# Patient Record
Sex: Female | Born: 1945 | Race: White | Hispanic: No | Marital: Married | Smoking: Never smoker
Health system: Southern US, Community
[De-identification: ages and names within clinical notes are randomized; demographics above are authoritative.]

## PROBLEM LIST (undated history)

## (undated) DIAGNOSIS — K7460 Unspecified cirrhosis of liver: Secondary | ICD-10-CM

## (undated) DIAGNOSIS — K760 Fatty (change of) liver, not elsewhere classified: Secondary | ICD-10-CM

## (undated) DIAGNOSIS — E785 Hyperlipidemia, unspecified: Secondary | ICD-10-CM

## (undated) DIAGNOSIS — E876 Hypokalemia: Secondary | ICD-10-CM

## (undated) DIAGNOSIS — K219 Gastro-esophageal reflux disease without esophagitis: Secondary | ICD-10-CM

## (undated) DIAGNOSIS — E119 Type 2 diabetes mellitus without complications: Secondary | ICD-10-CM

## (undated) DIAGNOSIS — C801 Malignant (primary) neoplasm, unspecified: Secondary | ICD-10-CM

## (undated) DIAGNOSIS — I10 Essential (primary) hypertension: Secondary | ICD-10-CM

## (undated) HISTORY — DX: Essential (primary) hypertension: I10

## (undated) HISTORY — PX: KNEE SURGERY: SHX244

## (undated) HISTORY — DX: Hypokalemia: E87.6

## (undated) HISTORY — DX: Hyperlipidemia, unspecified: E78.5

---

## 2004-07-22 ENCOUNTER — Ambulatory Visit: Payer: Self-pay | Admitting: Family Medicine

## 2004-10-31 ENCOUNTER — Ambulatory Visit: Payer: Self-pay | Admitting: Physician Assistant

## 2005-10-02 ENCOUNTER — Ambulatory Visit: Payer: Self-pay | Admitting: Family Medicine

## 2005-10-09 ENCOUNTER — Ambulatory Visit: Payer: Self-pay | Admitting: Family Medicine

## 2005-10-16 ENCOUNTER — Emergency Department: Payer: Self-pay | Admitting: Emergency Medicine

## 2005-12-25 ENCOUNTER — Ambulatory Visit: Payer: Self-pay | Admitting: Family Medicine

## 2006-10-19 ENCOUNTER — Ambulatory Visit: Payer: Self-pay | Admitting: Family Medicine

## 2007-06-24 LAB — HM COLONOSCOPY: HM COLON: NORMAL

## 2007-08-06 IMAGING — CR DG CHEST 2V
1 series · 2 of 2 positions shown · non-contrast
Comparison: none

REASON FOR EXAM: COUGH PERSISTENT
COMMENTS:

PROCEDURE:     DXR - DXR CHEST PA (OR AP) AND LATERAL  - October 02, 2005 [DATE]
RESULT:       The lung fields are clear.  No pneumonia, pneumothorax or
pleural effusion is seen.  The heart size is normal.  The mediastinal and
osseous structures show no significant abnormalities.

[Series 1: view not recorded · 0.17mm/px · 2 of 2 slices shown]
[im 1/2]
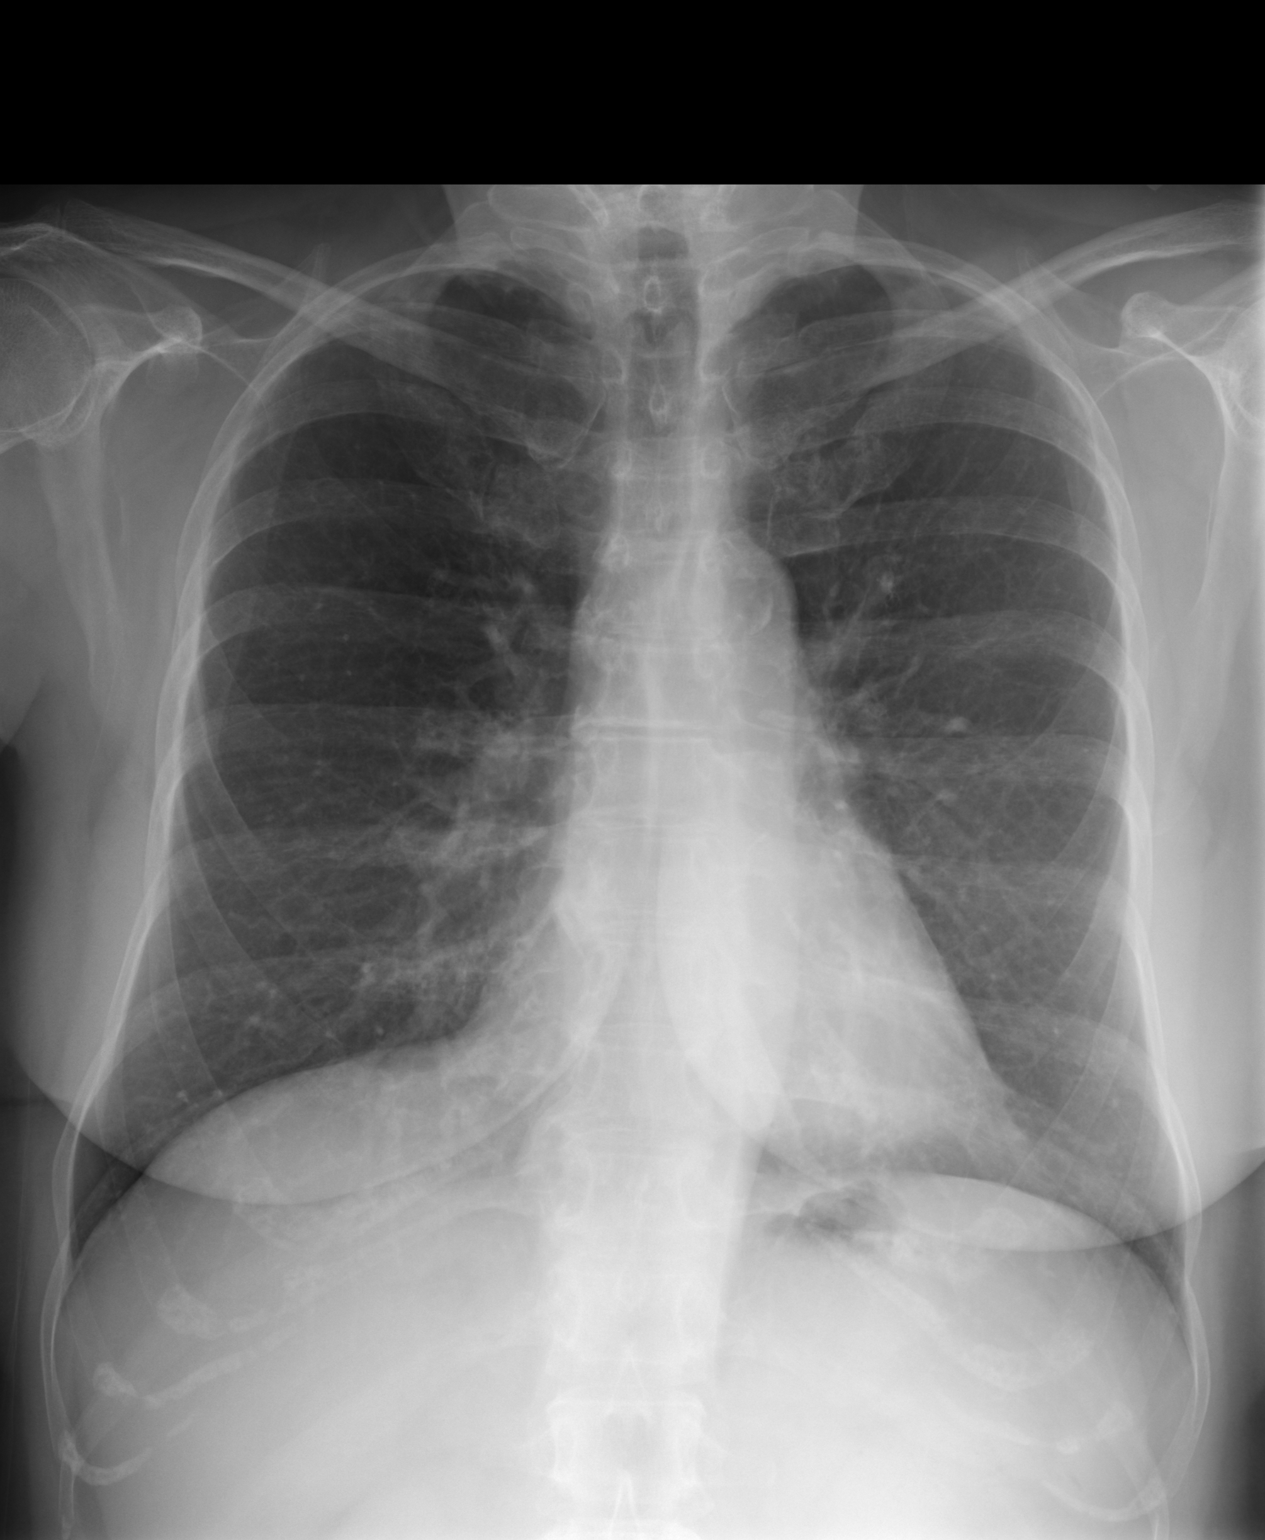
[im 2/2]
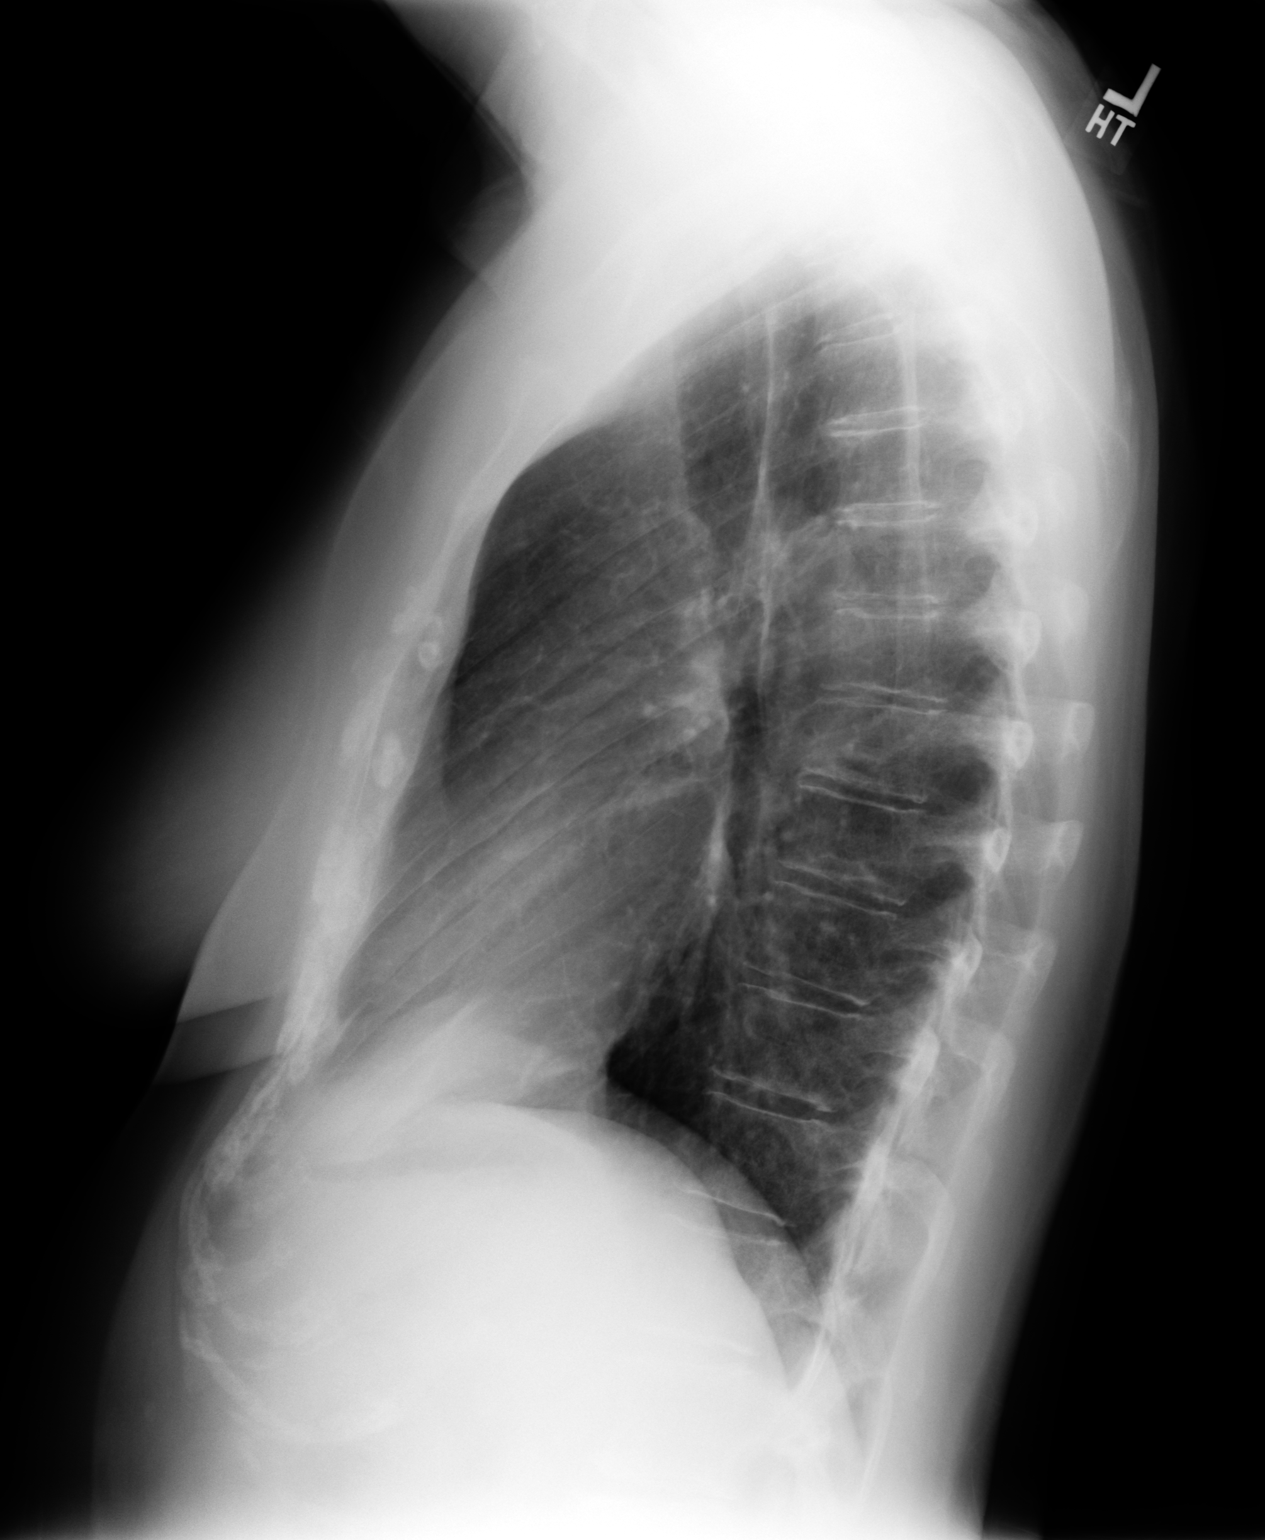

[2 of 2 positions shown; findings below may reference images not displayed]

IMPRESSION: No acute changes are identified.

## 2007-10-25 ENCOUNTER — Ambulatory Visit: Payer: Self-pay | Admitting: Family Medicine

## 2008-01-26 ENCOUNTER — Ambulatory Visit: Payer: Self-pay | Admitting: Gastroenterology

## 2008-11-26 ENCOUNTER — Ambulatory Visit: Payer: Self-pay | Admitting: Internal Medicine

## 2009-01-15 ENCOUNTER — Ambulatory Visit: Payer: Self-pay | Admitting: Family Medicine

## 2009-03-21 ENCOUNTER — Ambulatory Visit: Payer: Self-pay | Admitting: Family Medicine

## 2010-01-21 ENCOUNTER — Ambulatory Visit: Payer: Self-pay | Admitting: Family Medicine

## 2010-03-13 ENCOUNTER — Ambulatory Visit: Payer: Self-pay | Admitting: Gastroenterology

## 2011-01-28 ENCOUNTER — Ambulatory Visit: Payer: Self-pay | Admitting: Family Medicine

## 2011-06-26 LAB — HM PAP SMEAR: HM Pap smear: NORMAL

## 2011-07-22 ENCOUNTER — Ambulatory Visit: Payer: Self-pay | Admitting: Gastroenterology

## 2011-10-19 ENCOUNTER — Ambulatory Visit: Payer: Self-pay | Admitting: Family Medicine

## 2011-10-19 LAB — URINALYSIS, COMPLETE
Bacteria: NEGATIVE
Glucose,UR: NEGATIVE mg/dL (ref 0–75)
Ketone: NEGATIVE
Leukocyte Esterase: NEGATIVE
Nitrite: NEGATIVE
Specific Gravity: 1.02 (ref 1.003–1.030)

## 2011-10-21 LAB — URINE CULTURE

## 2012-02-11 ENCOUNTER — Ambulatory Visit: Payer: Self-pay | Admitting: Family Medicine

## 2012-07-28 ENCOUNTER — Ambulatory Visit: Payer: Self-pay | Admitting: Gastroenterology

## 2013-02-15 ENCOUNTER — Ambulatory Visit: Payer: Self-pay | Admitting: Family Medicine

## 2013-07-26 ENCOUNTER — Ambulatory Visit: Payer: Self-pay | Admitting: Gastroenterology

## 2014-01-20 ENCOUNTER — Ambulatory Visit: Payer: Self-pay | Admitting: Family Medicine

## 2014-01-20 LAB — HM DEXA SCAN: HM Dexa Scan: NORMAL

## 2014-02-16 ENCOUNTER — Ambulatory Visit: Payer: Self-pay | Admitting: Family Medicine

## 2014-02-16 LAB — HM MAMMOGRAPHY: HM Mammogram: NORMAL

## 2014-03-01 LAB — HM DIABETES EYE EXAM

## 2014-03-02 ENCOUNTER — Ambulatory Visit: Payer: Self-pay | Admitting: Unknown Physician Specialty

## 2014-03-29 ENCOUNTER — Ambulatory Visit: Payer: Self-pay | Admitting: Unknown Physician Specialty

## 2014-05-12 ENCOUNTER — Ambulatory Visit: Payer: Self-pay | Admitting: Gastroenterology

## 2014-10-23 LAB — BASIC METABOLIC PANEL
BUN: 12 mg/dL (ref 4–21)
Creatinine: 0.9 mg/dL (ref <=1.1)
Glucose: 126 mg/dL

## 2014-10-23 LAB — LIPID PANEL
CHOLESTEROL: 205 mg/dL — AB (ref 0–200)
HDL: 59 mg/dL (ref 35–70)
LDL Cholesterol: 106 mg/dL
TRIGLYCERIDES: 199 mg/dL — AB (ref 40–160)

## 2014-11-03 DIAGNOSIS — Z0000 Encounter for general adult medical examination without abnormal findings: Secondary | ICD-10-CM | POA: Insufficient documentation

## 2014-11-03 DIAGNOSIS — I10 Essential (primary) hypertension: Secondary | ICD-10-CM | POA: Insufficient documentation

## 2014-11-03 DIAGNOSIS — E7849 Other hyperlipidemia: Secondary | ICD-10-CM | POA: Insufficient documentation

## 2014-11-03 DIAGNOSIS — R69 Illness, unspecified: Secondary | ICD-10-CM | POA: Insufficient documentation

## 2014-11-03 DIAGNOSIS — Z1331 Encounter for screening for depression: Secondary | ICD-10-CM | POA: Insufficient documentation

## 2014-11-03 DIAGNOSIS — E876 Hypokalemia: Secondary | ICD-10-CM | POA: Insufficient documentation

## 2014-11-03 DIAGNOSIS — R945 Abnormal results of liver function studies: Secondary | ICD-10-CM | POA: Insufficient documentation

## 2014-12-05 ENCOUNTER — Ambulatory Visit (INDEPENDENT_AMBULATORY_CARE_PROVIDER_SITE_OTHER): Payer: Medicare Other | Admitting: Family Medicine

## 2014-12-05 ENCOUNTER — Encounter: Payer: Self-pay | Admitting: Family Medicine

## 2014-12-05 VITALS — BP 136/78 | HR 86 | Ht 66.0 in | Wt 162.0 lb

## 2014-12-05 DIAGNOSIS — T887XXA Unspecified adverse effect of drug or medicament, initial encounter: Secondary | ICD-10-CM | POA: Diagnosis not present

## 2014-12-05 DIAGNOSIS — I10 Essential (primary) hypertension: Secondary | ICD-10-CM

## 2014-12-05 DIAGNOSIS — L259 Unspecified contact dermatitis, unspecified cause: Secondary | ICD-10-CM | POA: Insufficient documentation

## 2014-12-05 DIAGNOSIS — T50905A Adverse effect of unspecified drugs, medicaments and biological substances, initial encounter: Secondary | ICD-10-CM

## 2014-12-05 MED ORDER — HYDROCHLOROTHIAZIDE 25 MG PO TABS: 25.0000 mg | ORAL_TABLET | Freq: Every day | ORAL | Status: AC

## 2014-12-05 MED ORDER — METOPROLOL SUCCINATE ER 100 MG PO TB24: 100.0000 mg | ORAL_TABLET | Freq: Every day | ORAL | Status: AC

## 2014-12-05 MED ORDER — TRIAMCINOLONE ACETONIDE 0.1 % EX CREA: 1.0000 "application " | TOPICAL_CREAM | Freq: Two times a day (BID) | CUTANEOUS | Status: DC

## 2014-12-05 MED ORDER — CLONIDINE HCL 0.1 MG PO TABS: 0.1000 mg | ORAL_TABLET | Freq: Two times a day (BID) | ORAL | Status: DC

## 2014-12-05 NOTE — Progress Notes (Signed)
Name: Kimberly Bright   MRN: [id number]    DOB: [birth year]   Date:12/05/2014       Progress Note  Subjective  Chief Complaint  Chief Complaint  Patient presents with  . Hypertension    increased to 49m on Amlodipine- increased ankle swelling     Hypertension This is a chronic problem. The current episode started more than 1 year ago. The problem has been gradually improving since onset. Associated symptoms include orthopnea and peripheral edema. Pertinent negatives include no anxiety, blurred vision, chest pain, headaches, malaise/fatigue, neck pain, palpitations, PND, shortness of breath or sweats. There are no associated agents to hypertension. There are no known risk factors for coronary artery disease. Past treatments include beta blockers, calcium channel blockers and diuretics. The current treatment provides moderate improvement. Compliance problems include medication side effects (edema).  There is no history of angina, kidney disease or CAD/MI.    No problem-specific assessment & plan notes found for this encounter.   Past Medical History  Diagnosis Date  . Hypertension   . Hyperlipidemia   . Hypokalemia     Past Surgical History  Procedure Laterality Date  . Knee surgery      meniscal tear repair    Family History  Problem Relation Age of Onset  . Cancer Mother     History   Social History  . Marital Status: Married    Spouse Name: N/A  . Number of Children: N/A  . Years of Education: N/A   Occupational History  . Not on file.   Social History Main Topics  . Smoking status: Never Smoker   . Smokeless tobacco: Not on file  . Alcohol Use: No  . Drug Use: No  . Sexual Activity: Not Currently   Other Topics Concern  . Not on file   Social History Narrative    Allergies  Allergen Reactions  . Ace Inhibitors      Review of Systems  Constitutional: Negative for malaise/fatigue.  HENT: Negative for ear pain, sore throat and tinnitus.   Eyes:  Negative for blurred vision, photophobia, discharge and redness.  Respiratory: Negative for cough, shortness of breath and wheezing.   Cardiovascular: Positive for orthopnea and leg swelling. Negative for chest pain, palpitations, claudication and PND.  Gastrointestinal: Negative for heartburn and nausea.  Genitourinary: Negative for dysuria and frequency.  Musculoskeletal: Negative for back pain and neck pain.  Skin: Positive for rash. Negative for itching.  Neurological: Negative for dizziness, weakness and headaches.  Endo/Heme/Allergies: Negative for environmental allergies. Does not bruise/bleed easily.  Psychiatric/Behavioral: Negative for depression.     Objective  Filed Vitals:   12/05/14 1339  BP: 136/78  Pulse: 86  Height: 5' 6"  (1.676 m)  Weight: 162 lb (73.483 kg)    Physical Exam  Constitutional: She is well-developed, well-nourished, and in no distress. No distress.  HENT:  Head: Normocephalic and atraumatic.  Right Ear: External ear normal.  Left Ear: External ear normal.  Nose: Nose normal.  Mouth/Throat: Oropharynx is clear and moist.  Eyes: Conjunctivae and EOM are normal. Pupils are equal, round, and reactive to light. Right eye exhibits no discharge. Left eye exhibits no discharge.  Neck: Normal range of motion. Neck supple. No JVD present. No thyromegaly present.  Cardiovascular: Normal rate, regular rhythm, normal heart sounds and intact distal pulses.  Exam reveals no gallop and no friction rub.   No murmur heard. Pulmonary/Chest: Effort normal and breath sounds normal. No respiratory distress. She has no  wheezes. She has no rales.  Abdominal: Soft. Bowel sounds are normal. She exhibits no mass. There is no tenderness. There is no guarding.  Musculoskeletal: Normal range of motion. She exhibits no edema.  Lymphadenopathy:    She has no cervical adenopathy.  Neurological: She is alert. She has normal reflexes.  Skin: Skin is warm and dry. Rash noted. She  is not diaphoretic. There is erythema.  Psychiatric: Mood and affect normal.      Recent Results (from the past 2160 hour(s))  Basic metabolic panel     Status: None   Collection Time: 10/23/14 12:00 AM  Result Value Ref Range   Glucose 126 mg/dL   BUN 12 4 - 21 mg/dL   Creatinine 0.9 .5 - 1.1 mg/dL  Lipid panel     Status: Abnormal   Collection Time: 10/23/14 12:00 AM  Result Value Ref Range   Triglycerides 199 (A) 40 - 160 mg/dL   Cholesterol 205 (A) 0 - 200 mg/dL   HDL 59 35 - 70 mg/dL   LDL Cholesterol 106 mg/dL     Assessment & Plan  Problem List Items Addressed This Visit      Cardiovascular and Mediastinum   Essential (primary) hypertension - Primary   Relevant Medications   amLODipine (NORVASC) 10 MG tablet   hydrochlorothiazide (HYDRODIURIL) 25 MG tablet   metoprolol succinate (TOPROL-XL) 100 MG 24 hr tablet   cloNIDine (CATAPRES) 0.1 MG tablet     Musculoskeletal and Integument   Contact dermatitis   Relevant Medications   triamcinolone cream (KENALOG) 0.1 %     Other   Medication side effect        Dr. Atzin Buchta [facility]  12/05/2014

## 2014-12-19 ENCOUNTER — Ambulatory Visit: Payer: Medicare Other | Admitting: Family Medicine

## 2015-05-05 ENCOUNTER — Other Ambulatory Visit: Payer: Self-pay | Admitting: Family Medicine

## 2015-05-23 ENCOUNTER — Other Ambulatory Visit: Payer: Self-pay | Admitting: Gastroenterology

## 2015-05-23 DIAGNOSIS — R748 Abnormal levels of other serum enzymes: Secondary | ICD-10-CM

## 2015-05-28 ENCOUNTER — Ambulatory Visit: Admission: RE | Admit: 2015-05-28 | Payer: Medicare Other | Source: Ambulatory Visit

## 2015-05-28 ENCOUNTER — Ambulatory Visit: Payer: Self-pay

## 2015-05-28 ENCOUNTER — Ambulatory Visit
Admission: RE | Admit: 2015-05-28 | Discharge: 2015-05-28 | Disposition: A | Discharge status: Home / Self Care | Payer: Medicare Other | Source: Ambulatory Visit | Attending: Gastroenterology | Admitting: Gastroenterology

## 2015-05-28 DIAGNOSIS — R748 Abnormal levels of other serum enzymes: Secondary | ICD-10-CM | POA: Insufficient documentation

## 2015-05-30 ENCOUNTER — Other Ambulatory Visit: Payer: Self-pay | Admitting: Family Medicine

## 2015-05-30 DIAGNOSIS — Z1231 Encounter for screening mammogram for malignant neoplasm of breast: Secondary | ICD-10-CM

## 2015-06-05 ENCOUNTER — Ambulatory Visit
Admission: RE | Admit: 2015-06-05 | Discharge: 2015-06-05 | Disposition: A | Discharge status: Home / Self Care | Payer: Medicare Other | Source: Ambulatory Visit | Attending: Family Medicine | Admitting: Family Medicine

## 2015-06-05 DIAGNOSIS — Z1231 Encounter for screening mammogram for malignant neoplasm of breast: Secondary | ICD-10-CM | POA: Diagnosis present

## 2015-06-05 HISTORY — DX: Malignant (primary) neoplasm, unspecified: C80.1

## 2015-10-04 ENCOUNTER — Encounter: Payer: Self-pay | Admitting: *Deleted

## 2015-10-08 ENCOUNTER — Other Ambulatory Visit: Payer: Self-pay

## 2015-10-08 ENCOUNTER — Ambulatory Visit
Admission: RE | Admit: 2015-10-08 | Discharge: 2015-10-08 | Disposition: A | Discharge status: Home / Self Care | Payer: Medicare Other | Source: Ambulatory Visit | Attending: Gastroenterology | Admitting: Gastroenterology

## 2015-10-08 ENCOUNTER — Encounter: Payer: Self-pay | Admitting: *Deleted

## 2015-10-08 ENCOUNTER — Encounter
Admission: RE | Disposition: A | Discharge status: Home / Self Care | Payer: Self-pay | Source: Ambulatory Visit | Attending: Gastroenterology

## 2015-10-08 ENCOUNTER — Ambulatory Visit: Payer: Medicare Other | Admitting: Anesthesiology

## 2015-10-08 DIAGNOSIS — Z85828 Personal history of other malignant neoplasm of skin: Secondary | ICD-10-CM | POA: Diagnosis not present

## 2015-10-08 DIAGNOSIS — Z888 Allergy status to other drugs, medicaments and biological substances status: Secondary | ICD-10-CM | POA: Insufficient documentation

## 2015-10-08 DIAGNOSIS — K319 Disease of stomach and duodenum, unspecified: Secondary | ICD-10-CM | POA: Insufficient documentation

## 2015-10-08 DIAGNOSIS — K2970 Gastritis, unspecified, without bleeding: Secondary | ICD-10-CM | POA: Diagnosis not present

## 2015-10-08 DIAGNOSIS — K760 Fatty (change of) liver, not elsewhere classified: Secondary | ICD-10-CM | POA: Insufficient documentation

## 2015-10-08 DIAGNOSIS — Z79899 Other long term (current) drug therapy: Secondary | ICD-10-CM | POA: Insufficient documentation

## 2015-10-08 DIAGNOSIS — K209 Esophagitis, unspecified: Secondary | ICD-10-CM | POA: Insufficient documentation

## 2015-10-08 DIAGNOSIS — E785 Hyperlipidemia, unspecified: Secondary | ICD-10-CM | POA: Insufficient documentation

## 2015-10-08 DIAGNOSIS — K7460 Unspecified cirrhosis of liver: Secondary | ICD-10-CM | POA: Insufficient documentation

## 2015-10-08 DIAGNOSIS — K449 Diaphragmatic hernia without obstruction or gangrene: Secondary | ICD-10-CM | POA: Diagnosis not present

## 2015-10-08 DIAGNOSIS — I10 Essential (primary) hypertension: Secondary | ICD-10-CM | POA: Insufficient documentation

## 2015-10-08 HISTORY — PX: ESOPHAGOGASTRODUODENOSCOPY (EGD) WITH PROPOFOL: SHX5813

## 2015-10-08 LAB — CBC WITH DIFFERENTIAL/PLATELET
BASOS ABS: 0 10*3/uL (ref 0–0.1)
BASOS PCT: 0 %
Eosinophils Absolute: 0.1 10*3/uL (ref 0–0.7)
Eosinophils Relative: 1 %
HEMATOCRIT: 41 % (ref 35.0–47.0)
Hemoglobin: 14.4 g/dL (ref 12.0–16.0)
Lymphocytes Relative: 38 %
Lymphs Abs: 2.3 10*3/uL (ref 1.0–3.6)
MCH: 31.8 pg (ref 26.0–34.0)
MCHC: 35.1 g/dL (ref 32.0–36.0)
MCV: 90.6 fL (ref 80.0–100.0)
MONO ABS: 0.5 10*3/uL (ref 0.2–0.9)
Monocytes Relative: 8 %
NEUTROS ABS: 3.1 10*3/uL (ref 1.4–6.5)
NEUTROS PCT: 53 %
PLATELETS: 187 10*3/uL (ref 150–440)
RBC: 4.52 MIL/uL (ref 3.80–5.20)
RDW: 13.1 % (ref 11.5–14.5)
WBC: 5.9 10*3/uL (ref 3.6–11.0)

## 2015-10-08 SURGERY — ESOPHAGOGASTRODUODENOSCOPY (EGD) WITH PROPOFOL
Anesthesia: General

## 2015-10-08 MED ORDER — LIDOCAINE HCL (CARDIAC) 20 MG/ML IV SOLN
INTRAVENOUS | Status: DC | PRN
  Administered 2015-10-08: 100 mg via INTRAVENOUS

## 2015-10-08 MED ORDER — SODIUM CHLORIDE 0.9 % IV SOLN
INTRAVENOUS | Status: DC
  Administered 2015-10-08: 11:00:00 via INTRAVENOUS

## 2015-10-08 MED ORDER — SODIUM CHLORIDE 0.9 % IV SOLN: INTRAVENOUS | Status: DC

## 2015-10-08 MED ORDER — PROPOFOL 500 MG/50ML IV EMUL
INTRAVENOUS | Status: DC | PRN
  Administered 2015-10-08: 140 ug/kg/min via INTRAVENOUS

## 2015-10-08 MED ORDER — FENTANYL CITRATE (PF) 100 MCG/2ML IJ SOLN
INTRAMUSCULAR | Status: DC | PRN
  Administered 2015-10-08: 50 ug via INTRAVENOUS

## 2015-10-08 MED ORDER — PROPOFOL 10 MG/ML IV BOLUS
INTRAVENOUS | Status: DC | PRN
  Administered 2015-10-08: 100 mg via INTRAVENOUS

## 2015-10-08 NOTE — Anesthesia Preprocedure Evaluation (Signed)
Anesthesia Evaluation  Patient identified by MRN, date of birth, ID band Patient awake    Reviewed: Allergy & Precautions, NPO status , Patient's Chart, lab work & pertinent test results  Airway Mallampati: III       Dental  (+) Teeth Intact, Caps   Pulmonary    breath sounds clear to auscultation       Cardiovascular Exercise Tolerance: Good hypertension, Pt. on medications and Pt. on home beta blockers  Rhythm:Regular Rate:Normal     Neuro/Psych    GI/Hepatic negative GI ROS, Neg liver ROS,   Endo/Other  negative endocrine ROS  Renal/GU negative Renal ROS     Musculoskeletal   Abdominal Normal abdominal exam  (+)   Peds  Hematology   Anesthesia Other Findings   Reproductive/Obstetrics                             Anesthesia Physical Anesthesia Plan  ASA: II  Anesthesia Plan: General   Post-op Pain Management:    Induction: Intravenous  Airway Management Planned: Natural Airway and Nasal Cannula  Additional Equipment:   Intra-op Plan:   Post-operative Plan:   Informed Consent: I have reviewed the patients History and Physical, chart, labs and discussed the procedure including the risks, benefits and alternatives for the proposed anesthesia with the patient or authorized representative who has indicated his/her understanding and acceptance.     Plan Discussed with: CRNA  Anesthesia Plan Comments:         Anesthesia Quick Evaluation

## 2015-10-08 NOTE — Op Note (Addendum)
[facility] Gastroenterology Patient Name: Kimberly Bright Procedure Date: 10/08/2015 12:55 PM MRN: [id number] Account #: [id number] Date of Birth: 08-25-1945 Admit Type: Outpatient Age: 70 Room: [facility] ENDO ROOM 3 Gender: Female Note Status: Supervisor Override Procedure:            Upper GI endoscopy Indications:          cirrhosis, NAFLD Providers:            Lollie Sails, MD Referring MD:         Sofie Hartigan (Referring MD) Complications:        No immediate complications. Procedure:            Pre-Anesthesia Assessment:                       - ASA Grade Assessment: II - A patient with mild                        systemic disease.                       After obtaining informed consent, the endoscope was                        passed under direct vision. Throughout the procedure,                        the patient's blood pressure, pulse, and oxygen                        saturations were monitored continuously. The Endoscope                        was introduced through the mouth, and advanced to the                        third part of duodenum. The upper GI endoscopy was                        accomplished without difficulty. The patient tolerated                        the procedure well. Findings:      LA Grade A-B (one or more mucosal breaks greater than 5 mm, not       extending between the tops of two mucosal folds) esophagitis with no       bleeding was found. Biopsies were taken with a cold forceps for       histology.      Diffuse mild inflammation characterized by erythema was found in the       gastric body. Biopsies were taken with a cold forceps for histology.      Patchy moderate inflammation characterized by congestion (edema),       erosions and erythema was found in the gastric antrum. Biopsies were       taken with a cold forceps for histology. Biopsies were taken with a cold       forceps for Helicobacter pylori testing.      A  small hiatal hernia was found. The Z-line was a variable distance from       incisors; the hiatal hernia was sliding.  Diffuse and patchy mild inflammation characterized by congestion       (edema), erythema and granularity was found in the duodenal bulb and in       the second portion of the duodenum. Biopsies were taken with a cold       forceps for histology. Biopsies were taken with a cold forceps for       histology and Helicobacter pylori testing. Impression:           - LA Grade B reflux esophagitis. Biopsied.                       - Gastritis. Biopsied.                       - Erosive gastritis. Biopsied.                       - Small hiatal hernia.                       - Duodenitis. Biopsied.                       - no evidence of esophageal varices. Recommendation:       - Use Prilosec (omeprazole) 20 mg PO daily for 2 months.                       - Await pathology results. Procedure Code(s):    --- Professional ---                       [phone], Esophagogastroduodenoscopy, flexible, transoral;                        with biopsy, single or multiple Diagnosis Code(s):    --- Professional ---                       K21.0, Gastro-esophageal reflux disease with esophagitis                       K29.70, Gastritis, unspecified, without bleeding                       K29.60, Other gastritis without bleeding                       K44.9, Diaphragmatic hernia without obstruction or                        gangrene                       K29.80, Duodenitis without bleeding CPT copyright 2016 American Medical Association. All rights reserved. The codes documented in this report are preliminary and upon coder review may  be revised to meet current compliance requirements. Lollie Sails, MD 10/08/2015 1:16:08 PM This report has been signed electronically. Number of Addenda: 0 Note Initiated On: 10/08/2015 12:55 PM      [facility]

## 2015-10-08 NOTE — Anesthesia Postprocedure Evaluation (Signed)
Anesthesia Post Note  Patient: Kimberly Bright  Procedure(s) Performed: Procedure(s) (LRB): ESOPHAGOGASTRODUODENOSCOPY (EGD) WITH PROPOFOL (N/A)  Patient location during evaluation: PACU Anesthesia Type: General Level of consciousness: awake Pain management: pain level controlled Vital Signs Assessment: post-procedure vital signs reviewed and stable Respiratory status: spontaneous breathing Cardiovascular status: blood pressure returned to baseline Anesthetic complications: no    Last Vitals:  Filed Vitals:   10/08/15 1012 10/08/15 1315  BP: 154/65 120/65  Pulse: 65 79  Temp: 36.5 C 36.6 C  Resp: 16 16    Last Pain: There were no vitals filed for this visit.               VAN STAVEREN,Paeton Studer

## 2015-10-08 NOTE — H&P (Signed)
Outpatient short stay form Pre-procedure 10/08/2015 12:11 PM Lollie Sails MD  Primary Physician: Dr. Thereasa Distance  Reason for visit:  EGD  History of present illness:  Patient is a 70 year old female presenting today for EGD in regards her personal history of [place]/cirrhosis. sHe has not had previous EGD. She takes no blood thinning agents or aspirin products.    Current facility-administered medications:  .  0.9 %  sodium chloride infusion, , Intravenous, Continuous, Lollie Sails, MD, Last Rate: 20 mL/hr at 10/08/15 1030 .  0.9 %  sodium chloride infusion, , Intravenous, Continuous, Lollie Sails, MD  Prescriptions prior to admission  Medication Sig Dispense Refill Last Dose  . amLODipine (NORVASC) 10 MG tablet TAKE (1) TABLET BY MOUTH EVERY DAY 30 tablet 0 10/08/2015 at 0600  . CHOLINE BITARTRATE Take 5 tablets by mouth daily.   10/07/2015 at Unknown time  . hydrochlorothiazide (HYDRODIURIL) 25 MG tablet Take 1 tablet (25 mg total) by mouth daily. 30 tablet 5 10/07/2015 at Unknown time  . metoprolol succinate (TOPROL-XL) 100 MG 24 hr tablet Take 1 tablet (100 mg total) by mouth daily. 30 tablet 5 10/08/2015 at 0600  . Milk Thistle 250 MG CAPS Take 1 capsule by mouth daily.   Past Week at Unknown time  . MULTIPLE VITAMINS-MINERALS PO Take 1 tablet by mouth daily.   Past Week at Unknown time  . Omega 3 1000 MG CAPS Take 2 capsules by mouth daily.    Past Week at Unknown time  . potassium chloride SA (K-DUR,KLOR-CON) 20 MEQ tablet Take 1 tablet by mouth daily.   10/07/2015 at Unknown time  . vitamin E 400 UNIT capsule Take 1 capsule by mouth daily.   10/07/2015 at Unknown time  . cloNIDine (CATAPRES) 0.1 MG tablet Take 1 tablet (0.1 mg total) by mouth 2 (two) times daily. 60 tablet 5   . triamcinolone cream (KENALOG) 0.1 % Apply 1 application topically 2 (two) times daily. 30 g 0      Allergies  Allergen Reactions  . Ace Inhibitors Cough  . Lipitor [Atorvastatin] Other (See  Comments)    Muscle Aches     Past Medical History  Diagnosis Date  . Hypertension   . Hyperlipidemia   . Hypokalemia   . Cancer ([place])     skin ca    Review of systems:      Physical Exam    Heart and lungs: Regular rate and rhythm without rub or gallop, lungs are bilaterally clear.    HEENT: Normocephalic atraumatic eyes are anicteric    Other:     Pertinant exam for procedure: Soft nontender nondistended bowel sounds positive normoactive.    Planned proceedures: EGD and indicated procedures. I have discussed the risks benefits and complications of procedures to include not limited to bleeding, infection, perforation and the risk of sedation and the patient wishes to proceed.    Lollie Sails, MD Gastroenterology 10/08/2015  12:11 PM

## 2015-10-08 NOTE — Transfer of Care (Signed)
Immediate Anesthesia Transfer of Care Note  Patient: Kimberly Bright  Procedure(s) Performed: Procedure(s): ESOPHAGOGASTRODUODENOSCOPY (EGD) WITH PROPOFOL (N/A)  Patient Location: PACU  Anesthesia Type:General  Level of Consciousness: awake and patient cooperative  Airway & Oxygen Therapy: Patient Spontanous Breathing and Patient connected to nasal cannula oxygen  Post-op Assessment: Report given to RN and Post -op Vital signs reviewed and stable  Post vital signs: Reviewed and stable  Last Vitals:  Filed Vitals:   10/08/15 1012 10/08/15 1315  BP: 154/65 120/65  Pulse: 65 79  Temp: 36.5 C 36.6 C  Resp: 16 16    Complications: No apparent anesthesia complications

## 2015-10-10 LAB — SURGICAL PATHOLOGY

## 2015-12-05 ENCOUNTER — Other Ambulatory Visit
Admission: RE | Admit: 2015-12-05 | Discharge: 2015-12-05 | Disposition: A | Discharge status: Home / Self Care | Payer: Medicare Other | Source: Ambulatory Visit | Attending: Gastroenterology | Admitting: Gastroenterology

## 2015-12-05 DIAGNOSIS — K760 Fatty (change of) liver, not elsewhere classified: Secondary | ICD-10-CM | POA: Insufficient documentation

## 2015-12-05 LAB — CBC WITH DIFFERENTIAL/PLATELET
BASOS PCT: 1 %
Basophils Absolute: 0.1 10*3/uL (ref 0–0.1)
EOS ABS: 0.1 10*3/uL (ref 0–0.7)
EOS PCT: 2 %
HCT: 43.6 % (ref 35.0–47.0)
Hemoglobin: 15.2 g/dL (ref 12.0–16.0)
LYMPHS ABS: 2.5 10*3/uL (ref 1.0–3.6)
Lymphocytes Relative: 40 %
MCH: 31.6 pg (ref 26.0–34.0)
MCHC: 35 g/dL (ref 32.0–36.0)
MCV: 90.2 fL (ref 80.0–100.0)
Monocytes Absolute: 0.5 10*3/uL (ref 0.2–0.9)
Monocytes Relative: 7 %
Neutro Abs: 3.2 10*3/uL (ref 1.4–6.5)
Neutrophils Relative %: 50 %
PLATELETS: 189 10*3/uL (ref 150–440)
RBC: 4.83 MIL/uL (ref 3.80–5.20)
RDW: 13.3 % (ref 11.5–14.5)
Smear Review: NONE SEEN
WBC: 6.3 10*3/uL (ref 3.6–11.0)

## 2015-12-05 LAB — HEPATIC FUNCTION PANEL
ALT: 55 U/L — ABNORMAL HIGH (ref 14–54)
AST: 48 U/L — AB (ref 15–41)
Albumin: 4.5 g/dL (ref 3.5–5.0)
Alkaline Phosphatase: 56 U/L (ref 38–126)
Total Bilirubin: 0.8 mg/dL (ref 0.3–1.2)
Total Protein: 7.6 g/dL (ref 6.5–8.1)

## 2015-12-06 LAB — HEAVY METALS, BLOOD
ARSENIC: 9 ug/L (ref 2–23)
Lead: 1 ug/dL (ref 0–19)
MERCURY: NOT DETECTED ug/L (ref 0.0–14.9)

## 2016-06-24 ENCOUNTER — Other Ambulatory Visit: Payer: Self-pay | Admitting: Family Medicine

## 2016-06-24 DIAGNOSIS — Z1231 Encounter for screening mammogram for malignant neoplasm of breast: Secondary | ICD-10-CM

## 2016-07-08 ENCOUNTER — Ambulatory Visit
Admission: RE | Admit: 2016-07-08 | Discharge: 2016-07-08 | Disposition: A | Discharge status: Home / Self Care | Payer: Medicare Other | Source: Ambulatory Visit | Attending: Family Medicine | Admitting: Family Medicine

## 2016-07-08 DIAGNOSIS — Z1231 Encounter for screening mammogram for malignant neoplasm of breast: Secondary | ICD-10-CM

## 2016-12-31 ENCOUNTER — Other Ambulatory Visit: Payer: Self-pay | Admitting: Gastroenterology

## 2016-12-31 DIAGNOSIS — K760 Fatty (change of) liver, not elsewhere classified: Secondary | ICD-10-CM

## 2017-01-05 ENCOUNTER — Ambulatory Visit
Admission: RE | Admit: 2017-01-05 | Discharge: 2017-01-05 | Disposition: A | Discharge status: Home / Self Care | Payer: Medicare Other | Source: Ambulatory Visit | Attending: Gastroenterology | Admitting: Gastroenterology

## 2017-01-05 DIAGNOSIS — K760 Fatty (change of) liver, not elsewhere classified: Secondary | ICD-10-CM | POA: Diagnosis present

## 2017-05-13 ENCOUNTER — Encounter: Payer: Self-pay | Admitting: *Deleted

## 2017-05-18 ENCOUNTER — Ambulatory Visit: Payer: Medicare Other | Admitting: Anesthesiology

## 2017-05-18 ENCOUNTER — Encounter: Payer: Self-pay | Admitting: *Deleted

## 2017-05-18 ENCOUNTER — Ambulatory Visit
Admission: RE | Admit: 2017-05-18 | Discharge: 2017-05-18 | Disposition: A | Discharge status: Home / Self Care | Payer: Medicare Other | Source: Ambulatory Visit | Attending: Gastroenterology | Admitting: Gastroenterology

## 2017-05-18 ENCOUNTER — Encounter
Admission: RE | Disposition: A | Discharge status: Home / Self Care | Payer: Self-pay | Source: Ambulatory Visit | Attending: Gastroenterology

## 2017-05-18 DIAGNOSIS — K7460 Unspecified cirrhosis of liver: Secondary | ICD-10-CM | POA: Insufficient documentation

## 2017-05-18 DIAGNOSIS — E876 Hypokalemia: Secondary | ICD-10-CM | POA: Diagnosis not present

## 2017-05-18 DIAGNOSIS — Z85828 Personal history of other malignant neoplasm of skin: Secondary | ICD-10-CM | POA: Diagnosis not present

## 2017-05-18 DIAGNOSIS — Z79899 Other long term (current) drug therapy: Secondary | ICD-10-CM | POA: Insufficient documentation

## 2017-05-18 DIAGNOSIS — D125 Benign neoplasm of sigmoid colon: Secondary | ICD-10-CM | POA: Diagnosis not present

## 2017-05-18 DIAGNOSIS — I10 Essential (primary) hypertension: Secondary | ICD-10-CM | POA: Insufficient documentation

## 2017-05-18 DIAGNOSIS — Z1211 Encounter for screening for malignant neoplasm of colon: Secondary | ICD-10-CM | POA: Insufficient documentation

## 2017-05-18 DIAGNOSIS — Z888 Allergy status to other drugs, medicaments and biological substances status: Secondary | ICD-10-CM | POA: Insufficient documentation

## 2017-05-18 DIAGNOSIS — K621 Rectal polyp: Secondary | ICD-10-CM | POA: Insufficient documentation

## 2017-05-18 DIAGNOSIS — E119 Type 2 diabetes mellitus without complications: Secondary | ICD-10-CM | POA: Diagnosis not present

## 2017-05-18 DIAGNOSIS — K5790 Diverticulosis of intestine, part unspecified, without perforation or abscess without bleeding: Secondary | ICD-10-CM | POA: Insufficient documentation

## 2017-05-18 DIAGNOSIS — K648 Other hemorrhoids: Secondary | ICD-10-CM | POA: Insufficient documentation

## 2017-05-18 DIAGNOSIS — E785 Hyperlipidemia, unspecified: Secondary | ICD-10-CM | POA: Insufficient documentation

## 2017-05-18 HISTORY — DX: Fatty (change of) liver, not elsewhere classified: K76.0

## 2017-05-18 HISTORY — DX: Type 2 diabetes mellitus without complications: E11.9

## 2017-05-18 HISTORY — DX: Unspecified cirrhosis of liver: K74.60

## 2017-05-18 HISTORY — PX: COLONOSCOPY: SHX5424

## 2017-05-18 SURGERY — COLONOSCOPY
Anesthesia: General

## 2017-05-18 MED ORDER — PROPOFOL 10 MG/ML IV BOLUS
INTRAVENOUS | Status: AC
  Filled 2017-05-18: qty 20

## 2017-05-18 MED ORDER — MIDAZOLAM HCL 2 MG/2ML IJ SOLN
INTRAMUSCULAR | Status: AC
  Filled 2017-05-18: qty 2

## 2017-05-18 MED ORDER — SODIUM CHLORIDE 0.9 % IV SOLN
INTRAVENOUS | Status: DC
  Administered 2017-05-18: 1000 mL via INTRAVENOUS
  Administered 2017-05-18: 14:00:00 via INTRAVENOUS

## 2017-05-18 MED ORDER — PROPOFOL 500 MG/50ML IV EMUL
INTRAVENOUS | Status: AC
  Filled 2017-05-18: qty 50

## 2017-05-18 MED ORDER — FENTANYL CITRATE (PF) 100 MCG/2ML IJ SOLN
INTRAMUSCULAR | Status: DC | PRN
  Administered 2017-05-18: 50 ug via INTRAVENOUS

## 2017-05-18 MED ORDER — SODIUM CHLORIDE 0.9 % IV SOLN: INTRAVENOUS | Status: DC

## 2017-05-18 MED ORDER — PROPOFOL 500 MG/50ML IV EMUL
INTRAVENOUS | Status: DC | PRN
  Administered 2017-05-18: 100 ug/kg/min via INTRAVENOUS

## 2017-05-18 MED ORDER — EPHEDRINE SULFATE 50 MG/ML IJ SOLN
INTRAMUSCULAR | Status: AC
  Filled 2017-05-18: qty 1

## 2017-05-18 MED ORDER — FENTANYL CITRATE (PF) 100 MCG/2ML IJ SOLN
INTRAMUSCULAR | Status: AC
  Filled 2017-05-18: qty 2

## 2017-05-18 MED ORDER — MIDAZOLAM HCL 2 MG/2ML IJ SOLN
INTRAMUSCULAR | Status: DC | PRN
  Administered 2017-05-18: 1 mg via INTRAVENOUS

## 2017-05-18 MED ORDER — EPHEDRINE SULFATE 50 MG/ML IJ SOLN
INTRAMUSCULAR | Status: DC | PRN
  Administered 2017-05-18: 10 mg via INTRAVENOUS
  Administered 2017-05-18: 5 mg via INTRAVENOUS

## 2017-05-18 NOTE — Op Note (Signed)
[facility] Gastroenterology Patient Name: Kimberly Bright Procedure Date: 05/18/2017 1:32 PM MRN: [id number] Account #: [id number] Date of Birth: 09-29-1945 Admit Type: Outpatient Age: 71 Room: [facility] ENDO ROOM 1 Gender: Female Note Status: Finalized Procedure:            Colonoscopy Indications:          Screening for colorectal malignant neoplasm Providers:            Lollie Sails, MD Referring MD:         Sofie Hartigan (Referring MD) Medicines:            Monitored Anesthesia Care Complications:        No immediate complications. Procedure:            Pre-Anesthesia Assessment:                       - ASA Grade Assessment: II - A patient with mild                        systemic disease.                       After obtaining informed consent, the colonoscope was                        passed under direct vision. Throughout the procedure,                        the patient's blood pressure, pulse, and oxygen                        saturations were monitored continuously. The                        Colonoscope was introduced through the anus and                        advanced to the the cecum, identified by appendiceal                        orifice and ileocecal valve. The colonoscopy was                        unusually difficult due to significant looping and a                        tortuous colon. Successful completion of the procedure                        was aided by changing the patient to a supine position                        and using manual pressure. The patient tolerated the                        procedure well. The quality of the bowel preparation                        was good. Findings:      Two sessile polyps were found in the proximal rectum. The polyps  were 3       to 4 mm in size. These polyps were removed with a cold snare. Resection       and retrieval were complete.      A 2 mm polyp was found in the distal sigmoid colon.  The polyp was       sessile. The polyp was removed with a cold biopsy forceps. Resection and       retrieval were complete.      Three sessile polyps were found in the rectum. The polyps were less than       1 mm in size. These polyps were removed with a cold biopsy forceps.       Resection and retrieval were complete.      The digital rectal exam was normal. Impression:           - Two 3 to 4 mm polyps in the rectum, removed with a                        cold snare. Resected and retrieved.                       - One 2 mm polyp in the distal sigmoid colon, removed                        with a cold biopsy forceps. Resected and retrieved.                       - Three less than 1 mm polyps in the rectum, removed                        with a cold biopsy forceps. Resected and retrieved. Recommendation:       - Discharge patient to home.                       - Await pathology results.                       - Telephone GI clinic for pathology results in 1 week. Procedure Code(s):    --- Professional ---                       [id number], Colonoscopy, flexible; with removal of tumor(s),                        polyp(s), or other lesion(s) by snare technique                       45380, 49, Colonoscopy, flexible; with biopsy, single                        or multiple Diagnosis Code(s):    --- Professional ---                       Z12.11, Encounter for screening for malignant neoplasm                        of colon                       K62.1, Rectal polyp  D12.5, Benign neoplasm of sigmoid colon CPT copyright 2016 American Medical Association. All rights reserved. The codes documented in this report are preliminary and upon coder review may  be revised to meet current compliance requirements. Lollie Sails, MD 05/18/2017 2:15:33 PM This report has been signed electronically. Number of Addenda: 0 Note Initiated On: 05/18/2017 1:32 PM Scope Withdrawal Time: 0 hours 15  minutes 29 seconds  Total Procedure Duration: 0 hours 33 minutes 6 seconds       [facility]

## 2017-05-18 NOTE — Anesthesia Postprocedure Evaluation (Signed)
Anesthesia Post Note  Patient: Kimberly Bright  Procedure(s) Performed: COLONOSCOPY (N/A )  Patient location during evaluation: PACU Anesthesia Type: General Level of consciousness: awake Pain management: pain level controlled Vital Signs Assessment: post-procedure vital signs reviewed and stable Respiratory status: spontaneous breathing Cardiovascular status: stable Anesthetic complications: no     Last Vitals:  Vitals:   05/18/17 1204 05/18/17 1417  BP: (!) 147/74 (!) 98/54  Pulse: 67   Resp: 16   Temp: 36.8 C (!) 35.9 C  SpO2: 96%     Last Pain:  Vitals:   05/18/17 1417  TempSrc: Tympanic                 VAN STAVEREN,Elverta Dimiceli

## 2017-05-18 NOTE — Anesthesia Preprocedure Evaluation (Signed)
Anesthesia Evaluation  Patient identified by MRN, date of birth, ID band Patient awake    Reviewed: Allergy & Precautions, NPO status , Patient's Chart, lab work & pertinent test results  Airway Mallampati: II       Dental  (+) Teeth Intact   Pulmonary neg pulmonary ROS,    breath sounds clear to auscultation       Cardiovascular Exercise Tolerance: Good hypertension, Pt. on medications  Rhythm:Regular Rate:Normal     Neuro/Psych negative neurological ROS  negative psych ROS   GI/Hepatic negative GI ROS, Neg liver ROS,   Endo/Other  diabetes, Well Controlled  Renal/GU negative Renal ROS     Musculoskeletal   Abdominal Normal abdominal exam  (+)   Peds negative pediatric ROS (+)  Hematology negative hematology ROS (+)   Anesthesia Other Findings   Reproductive/Obstetrics                             Anesthesia Physical Anesthesia Plan  ASA: II  Anesthesia Plan: General   Post-op Pain Management:    Induction: Intravenous  PONV Risk Score and Plan:   Airway Management Planned: Natural Airway and Nasal Cannula  Additional Equipment:   Intra-op Plan:   Post-operative Plan:   Informed Consent: I have reviewed the patients History and Physical, chart, labs and discussed the procedure including the risks, benefits and alternatives for the proposed anesthesia with the patient or authorized representative who has indicated his/her understanding and acceptance.     Plan Discussed with: CRNA  Anesthesia Plan Comments:         Anesthesia Quick Evaluation

## 2017-05-18 NOTE — Transfer of Care (Signed)
Immediate Anesthesia Transfer of Care Note  Patient: Kimberly Bright  Procedure(s) Performed: COLONOSCOPY (N/A )  Patient Location: PACU  Anesthesia Type:General  Level of Consciousness: awake and sedated  Airway & Oxygen Therapy: Patient Spontanous Breathing and Patient connected to nasal cannula oxygen  Post-op Assessment: Report given to RN and Post -op Vital signs reviewed and stable  Post vital signs: Reviewed and stable  Last Vitals:  Vitals:   05/18/17 1204  BP: (!) 147/74  Pulse: 67  Resp: 16  Temp: 36.8 C  SpO2: 96%    Last Pain:  Vitals:   05/18/17 1204  TempSrc: Tympanic         Complications: No apparent anesthesia complications

## 2017-05-18 NOTE — Anesthesia Procedure Notes (Signed)
Performed by: Cook-Martin, Kyandra Mcclaine Pre-anesthesia Checklist: Patient identified, Emergency Drugs available, Suction available, Patient being monitored and Timeout performed Patient Re-evaluated:Patient Re-evaluated prior to induction Oxygen Delivery Method: Nasal cannula Preoxygenation: Pre-oxygenation with 100% oxygen Induction Type: IV induction Placement Confirmation: positive ETCO2 and CO2 detector       

## 2017-05-18 NOTE — Anesthesia Post-op Follow-up Note (Signed)
Anesthesia QCDR form completed.        

## 2017-05-18 NOTE — H&P (Signed)
Outpatient short stay form Pre-procedure 05/18/2017 1:11 PM Lollie Sails MD  Primary Physician: Dr. Thereasa Distance  Reason for visit:  Colonoscopy  History of present illness:  Patient is a 71 year old female presenting today as above. Several months ago she had a course of antibiotics that seem to give her issues with diarrhea. Her last colonoscopy was about 9 years ago. Unremarkable except for internal hemorrhoids. Patient did have some nausea and emesis with her prep and tolerated most of it. She states she is going clear this morning. She takes no aspirin or blood that he agents.    Current Facility-Administered Medications:  .  0.9 %  sodium chloride infusion, , Intravenous, Continuous, Lollie Sails, MD, Last Rate: 20 mL/hr at 05/18/17 1218, 1,000 mL at 05/18/17 1218 .  0.9 %  sodium chloride infusion, , Intravenous, Continuous, Lollie Sails, MD  Medications Prior to Admission  Medication Sig Dispense Refill Last Dose  . amLODipine (NORVASC) 10 MG tablet TAKE (1) TABLET BY MOUTH EVERY DAY 30 tablet 0 05/18/2017 at 0630  . CHOLINE BITARTRATE Take 5 tablets by mouth daily.   Past Week at Unknown time  . cloNIDine (CATAPRES) 0.1 MG tablet Take 1 tablet (0.1 mg total) by mouth 2 (two) times daily. 60 tablet 5 Past Week at Unknown time  . hydrochlorothiazide (HYDRODIURIL) 25 MG tablet Take 1 tablet (25 mg total) by mouth daily. 30 tablet 5 Past Week at Unknown time  . metoprolol succinate (TOPROL-XL) 100 MG 24 hr tablet Take 1 tablet (100 mg total) by mouth daily. 30 tablet 5 05/18/2017 at 0630  . Milk Thistle 250 MG CAPS Take 1 capsule by mouth daily.   Past Week at Unknown time  . MULTIPLE VITAMINS-MINERALS PO Take 1 tablet by mouth daily.   Past Week at Unknown time  . Omega 3 1000 MG CAPS Take 2 capsules by mouth daily.    Past Week at Unknown time  . potassium chloride SA (K-DUR,KLOR-CON) 20 MEQ tablet Take 1 tablet by mouth daily.   Past Week at Unknown time  .  vitamin E 400 UNIT capsule Take 1 capsule by mouth daily.   Past Week at Unknown time  . triamcinolone cream (KENALOG) 0.1 % Apply 1 application topically 2 (two) times daily. (Patient not taking: Reported on 05/18/2017) 30 g 0 Not Taking at Unknown time     Allergies  Allergen Reactions  . Ace Inhibitors Cough  . Lipitor [Atorvastatin] Other (See Comments)    Muscle Aches     Past Medical History:  Diagnosis Date  . Cancer ([place])    skin ca  . Cirrhosis ([place])   . Diabetes mellitus without complication ([place])   . Fatty liver   . Hyperlipidemia   . Hypertension   . Hypokalemia     Review of systems:      Physical Exam    Heart and lungs: Regular rate and rhythm without rub or gallop, lungs are bilaterally clear.    HEENT:     Other:     Pertinant exam for procedure: Soft nontender nondistended bowel sounds positive normoactive.    Planned proceedures: Colonoscopy and indicated procedures.    Lollie Sails, MD Gastroenterology 05/18/2017  1:11 PM

## 2017-05-19 ENCOUNTER — Encounter: Payer: Self-pay | Admitting: Gastroenterology

## 2017-05-20 LAB — SURGICAL PATHOLOGY

## 2017-08-13 ENCOUNTER — Other Ambulatory Visit: Payer: Self-pay | Admitting: Family Medicine

## 2017-08-13 DIAGNOSIS — Z1231 Encounter for screening mammogram for malignant neoplasm of breast: Secondary | ICD-10-CM

## 2017-08-19 ENCOUNTER — Ambulatory Visit
Admission: RE | Admit: 2017-08-19 | Discharge: 2017-08-19 | Disposition: A | Discharge status: Home / Self Care | Payer: Medicare Other | Source: Ambulatory Visit | Attending: Family Medicine | Admitting: Family Medicine

## 2017-08-19 DIAGNOSIS — Z1231 Encounter for screening mammogram for malignant neoplasm of breast: Secondary | ICD-10-CM | POA: Diagnosis not present

## 2018-04-05 ENCOUNTER — Other Ambulatory Visit: Payer: Self-pay

## 2018-04-05 ENCOUNTER — Encounter: Payer: Self-pay | Admitting: *Deleted

## 2018-04-09 NOTE — Discharge Instructions (Signed)

## 2018-04-13 ENCOUNTER — Ambulatory Visit
Admission: RE | Admit: 2018-04-13 | Discharge: 2018-04-13 | Disposition: A | Discharge status: Home / Self Care | Payer: Medicare Other | Source: Ambulatory Visit | Attending: Ophthalmology | Admitting: Ophthalmology

## 2018-04-13 ENCOUNTER — Ambulatory Visit: Payer: Medicare Other | Admitting: Anesthesiology

## 2018-04-13 ENCOUNTER — Encounter
Admission: RE | Disposition: A | Discharge status: Home / Self Care | Payer: Self-pay | Source: Ambulatory Visit | Attending: Ophthalmology

## 2018-04-13 DIAGNOSIS — I10 Essential (primary) hypertension: Secondary | ICD-10-CM | POA: Insufficient documentation

## 2018-04-13 DIAGNOSIS — C44519 Basal cell carcinoma of skin of other part of trunk: Secondary | ICD-10-CM | POA: Diagnosis not present

## 2018-04-13 DIAGNOSIS — K219 Gastro-esophageal reflux disease without esophagitis: Secondary | ICD-10-CM | POA: Diagnosis not present

## 2018-04-13 DIAGNOSIS — E7800 Pure hypercholesterolemia, unspecified: Secondary | ICD-10-CM | POA: Diagnosis not present

## 2018-04-13 DIAGNOSIS — H2512 Age-related nuclear cataract, left eye: Secondary | ICD-10-CM | POA: Insufficient documentation

## 2018-04-13 DIAGNOSIS — Z888 Allergy status to other drugs, medicaments and biological substances status: Secondary | ICD-10-CM | POA: Insufficient documentation

## 2018-04-13 DIAGNOSIS — Z79899 Other long term (current) drug therapy: Secondary | ICD-10-CM | POA: Diagnosis not present

## 2018-04-13 HISTORY — DX: Gastro-esophageal reflux disease without esophagitis: K21.9

## 2018-04-13 HISTORY — PX: CATARACT EXTRACTION W/PHACO: SHX586

## 2018-04-13 SURGERY — PHACOEMULSIFICATION, CATARACT, WITH IOL INSERTION
Anesthesia: Monitor Anesthesia Care | Site: Eye | Laterality: Left

## 2018-04-13 MED ORDER — TETRACAINE HCL 0.5 % OP SOLN
1.0000 [drp] | OPHTHALMIC | Status: DC | PRN
  Administered 2018-04-13 (×2): 1 [drp] via OPHTHALMIC

## 2018-04-13 MED ORDER — MOXIFLOXACIN HCL 0.5 % OP SOLN
OPHTHALMIC | Status: DC | PRN
  Administered 2018-04-13: 0.2 mL via OPHTHALMIC

## 2018-04-13 MED ORDER — LIDOCAINE HCL (PF) 2 % IJ SOLN
INTRAOCULAR | Status: DC | PRN
  Administered 2018-04-13: 10:00:00 via INTRAOCULAR

## 2018-04-13 MED ORDER — EPINEPHRINE PF 1 MG/ML IJ SOLN
INTRAOCULAR | Status: DC | PRN
  Administered 2018-04-13: 72 mL via OPHTHALMIC

## 2018-04-13 MED ORDER — SODIUM HYALURONATE 23 MG/ML IO SOLN
INTRAOCULAR | Status: DC | PRN
  Administered 2018-04-13: 0.6 mL via INTRAOCULAR

## 2018-04-13 MED ORDER — ARMC OPHTHALMIC DILATING DROPS
1.0000 "application " | OPHTHALMIC | Status: DC | PRN
  Administered 2018-04-13 (×3): 1 via OPHTHALMIC

## 2018-04-13 MED ORDER — SODIUM HYALURONATE 10 MG/ML IO SOLN
INTRAOCULAR | Status: DC | PRN
  Administered 2018-04-13: 0.55 mL via INTRAOCULAR

## 2018-04-13 MED ORDER — FENTANYL CITRATE (PF) 100 MCG/2ML IJ SOLN
INTRAMUSCULAR | Status: DC | PRN
  Administered 2018-04-13: 50 ug via INTRAVENOUS

## 2018-04-13 MED ORDER — LACTATED RINGERS IV SOLN: INTRAVENOUS | Status: DC

## 2018-04-13 MED ORDER — MIDAZOLAM HCL 2 MG/2ML IJ SOLN
INTRAMUSCULAR | Status: DC | PRN
  Administered 2018-04-13: 2 mg via INTRAVENOUS

## 2018-04-13 SURGICAL SUPPLY — 17 items
CANNULA ANT/CHMB 27G (MISCELLANEOUS) ×1 IMPLANT
CANNULA ANT/CHMB 27GA (MISCELLANEOUS) ×3 IMPLANT
DISSECTOR HYDRO NUCLEUS 50X22 (MISCELLANEOUS) ×3 IMPLANT
GLOVE BIO SURGEON STRL SZ8 (GLOVE) ×3 IMPLANT
GLOVE SURG LX 7.5 STRW (GLOVE) ×2
GLOVE SURG LX STRL 7.5 STRW (GLOVE) ×1 IMPLANT
GOWN STRL REUS W/ TWL LRG LVL3 (GOWN DISPOSABLE) ×2 IMPLANT
GOWN STRL REUS W/TWL LRG LVL3 (GOWN DISPOSABLE) ×4
LENS IOL TECNIS ITEC 21.0 (Intraocular Lens) ×2 IMPLANT
MARKER SKIN DUAL TIP RULER LAB (MISCELLANEOUS) ×3 IMPLANT
PACK DR. KING ARMS (PACKS) ×3 IMPLANT
PACK EYE AFTER SURG (MISCELLANEOUS) ×3 IMPLANT
PACK OPTHALMIC (MISCELLANEOUS) ×3 IMPLANT
SYR 3ML LL SCALE MARK (SYRINGE) ×3 IMPLANT
SYR TB 1ML LUER SLIP (SYRINGE) ×3 IMPLANT
WATER STERILE IRR 500ML POUR (IV SOLUTION) ×3 IMPLANT
WIPE NON LINTING 3.25X3.25 (MISCELLANEOUS) ×3 IMPLANT

## 2018-04-13 NOTE — Anesthesia Preprocedure Evaluation (Signed)
Anesthesia Evaluation  Patient identified by MRN, date of birth, ID band Patient awake    Reviewed: Allergy & Precautions, H&P , NPO status , Patient's Chart, lab work & pertinent test results  Airway Mallampati: II  TM Distance: >3 FB Neck ROM: full    Dental no notable dental hx.    Pulmonary    Pulmonary exam normal breath sounds clear to auscultation       Cardiovascular hypertension, Normal cardiovascular exam Rhythm:regular Rate:Normal     Neuro/Psych    GI/Hepatic GERD  ,  Endo/Other    Renal/GU      Musculoskeletal   Abdominal   Peds  Hematology   Anesthesia Other Findings   Reproductive/Obstetrics                             Anesthesia Physical Anesthesia Plan  ASA: II  Anesthesia Plan: MAC   Post-op Pain Management:    Induction:   PONV Risk Score and Plan: 2 and Midazolam and Treatment may vary due to age or medical condition  Airway Management Planned:   Additional Equipment:   Intra-op Plan:   Post-operative Plan:   Informed Consent: I have reviewed the patients History and Physical, chart, labs and discussed the procedure including the risks, benefits and alternatives for the proposed anesthesia with the patient or authorized representative who has indicated his/her understanding and acceptance.     Plan Discussed with: CRNA  Anesthesia Plan Comments:         Anesthesia Quick Evaluation

## 2018-04-13 NOTE — Anesthesia Postprocedure Evaluation (Signed)
Anesthesia Post Note  Patient: JENYFER TRAWICK  Procedure(s) Performed: CATARACT EXTRACTION PHACO AND INTRAOCULAR LENS PLACEMENT ([place]) LEFT (Left Eye)  Patient location during evaluation: PACU Anesthesia Type: MAC Level of consciousness: awake and alert and oriented Pain management: satisfactory to patient Vital Signs Assessment: post-procedure vital signs reviewed and stable Respiratory status: spontaneous breathing, nonlabored ventilation and respiratory function stable Cardiovascular status: blood pressure returned to baseline and stable Postop Assessment: Adequate PO intake and No signs of nausea or vomiting Anesthetic complications: no    Raliegh Ip

## 2018-04-13 NOTE — Anesthesia Procedure Notes (Signed)
Procedure Name: MAC Performed by: Briahna Pescador, CRNA Pre-anesthesia Checklist: Patient identified, Emergency Drugs available, Suction available, Timeout performed and Patient being monitored Patient Re-evaluated:Patient Re-evaluated prior to induction Oxygen Delivery Method: Nasal cannula Placement Confirmation: positive ETCO2       

## 2018-04-13 NOTE — Op Note (Signed)
OPERATIVE NOTE  Kimberly Bright [id number] 04/13/2018   PREOPERATIVE DIAGNOSIS:  Nuclear sclerotic cataract left eye.  H25.12   POSTOPERATIVE DIAGNOSIS:    Nuclear sclerotic cataract left eye.     PROCEDURE:  Phacoemusification with posterior chamber intraocular lens placement of the left eye   LENS:   Implant Name Type Inv. Item Serial No. Manufacturer Lot No. LRB No. Used  LENS IOL DIOP 21.0 - [id number] Intraocular Lens LENS IOL DIOP 21.0 [id number] AMO  Left 1       PCB00 +21.0   ULTRASOUND TIME: 0 minutes 36 seconds.  CDE 3.93   SURGEON:  Benay Pillow, MD, MPH   ANESTHESIA:  Topical with tetracaine drops augmented with 1% preservative-free intracameral lidocaine.  ESTIMATED BLOOD LOSS: <1 mL   COMPLICATIONS:  None.   DESCRIPTION OF PROCEDURE:  The patient was identified in the holding room and transported to the operating room and placed in the supine position under the operating microscope.  The left eye was identified as the operative eye and it was prepped and draped in the usual sterile ophthalmic fashion.   A 1.0 millimeter clear-corneal paracentesis was made at the 5:00 position. 0.5 ml of preservative-free 1% lidocaine with epinephrine was injected into the anterior chamber.  The anterior chamber was filled with Healon 5 viscoelastic.  A 2.4 millimeter keratome was used to make a near-clear corneal incision at the 2:00 position.  A curvilinear capsulorrhexis was made with a cystotome and capsulorrhexis forceps.  Balanced salt solution was used to hydrodissect and hydrodelineate the nucleus.   Phacoemulsification was then used in stop and chop fashion to remove the lens nucleus and epinucleus.  The remaining cortex was then removed using the irrigation and aspiration handpiece. Healon was then placed into the capsular bag to distend it for lens placement.  A lens was then injected into the capsular bag.  The remaining viscoelastic was aspirated.   Wounds were hydrated  with balanced salt solution.  The anterior chamber was inflated to a physiologic pressure with balanced salt solution.   Intracameral vigamox 0.1 mL undiltued was injected into the eye and a drop placed onto the ocular surface.  No wound leaks were noted.  The patient was taken to the recovery room in stable condition without complications of anesthesia or surgery  Benay Pillow 04/13/2018, 10:22 AM

## 2018-04-13 NOTE — H&P (Signed)
The History and Physical notes are on paper, have been signed, and are to be scanned.   I have examined the patient and there are no changes to the H&P.   Kimberly Bright 04/13/2018 9:54 AM

## 2018-04-13 NOTE — Transfer of Care (Signed)
Immediate Anesthesia Transfer of Care Note  Patient: Kimberly Bright  Procedure(s) Performed: CATARACT EXTRACTION PHACO AND INTRAOCULAR LENS PLACEMENT ([facility]) LEFT (Left Eye)  Patient Location: PACU  Anesthesia Type: MAC  Level of Consciousness: awake, alert  and patient cooperative  Airway and Oxygen Therapy: Patient Spontanous Breathing and Patient connected to supplemental oxygen  Post-op Assessment: Post-op Vital signs reviewed, Patient's Cardiovascular Status Stable, Respiratory Function Stable, Patent Airway and No signs of Nausea or vomiting  Post-op Vital Signs: Reviewed and stable  Complications: No apparent anesthesia complications

## 2018-04-14 ENCOUNTER — Encounter: Payer: Self-pay | Admitting: Ophthalmology

## 2018-05-03 ENCOUNTER — Other Ambulatory Visit: Payer: Self-pay

## 2018-05-03 ENCOUNTER — Encounter: Payer: Self-pay | Admitting: *Deleted

## 2018-05-06 NOTE — Discharge Instructions (Signed)

## 2018-05-10 ENCOUNTER — Ambulatory Visit
Admission: RE | Admit: 2018-05-10 | Discharge: 2018-05-10 | Disposition: A | Discharge status: Home / Self Care | Payer: Medicare Other | Source: Ambulatory Visit | Attending: Ophthalmology | Admitting: Ophthalmology

## 2018-05-10 ENCOUNTER — Ambulatory Visit: Payer: Medicare Other | Admitting: Anesthesiology

## 2018-05-10 ENCOUNTER — Encounter
Admission: RE | Disposition: A | Discharge status: Home / Self Care | Payer: Self-pay | Source: Ambulatory Visit | Attending: Ophthalmology

## 2018-05-10 DIAGNOSIS — Z888 Allergy status to other drugs, medicaments and biological substances status: Secondary | ICD-10-CM | POA: Diagnosis not present

## 2018-05-10 DIAGNOSIS — E1136 Type 2 diabetes mellitus with diabetic cataract: Secondary | ICD-10-CM | POA: Diagnosis not present

## 2018-05-10 DIAGNOSIS — K219 Gastro-esophageal reflux disease without esophagitis: Secondary | ICD-10-CM | POA: Insufficient documentation

## 2018-05-10 DIAGNOSIS — K760 Fatty (change of) liver, not elsewhere classified: Secondary | ICD-10-CM | POA: Insufficient documentation

## 2018-05-10 DIAGNOSIS — H2511 Age-related nuclear cataract, right eye: Secondary | ICD-10-CM | POA: Insufficient documentation

## 2018-05-10 DIAGNOSIS — Z9842 Cataract extraction status, left eye: Secondary | ICD-10-CM | POA: Diagnosis not present

## 2018-05-10 DIAGNOSIS — I10 Essential (primary) hypertension: Secondary | ICD-10-CM | POA: Diagnosis not present

## 2018-05-10 DIAGNOSIS — Z961 Presence of intraocular lens: Secondary | ICD-10-CM | POA: Insufficient documentation

## 2018-05-10 DIAGNOSIS — E7800 Pure hypercholesterolemia, unspecified: Secondary | ICD-10-CM | POA: Diagnosis not present

## 2018-05-10 HISTORY — PX: CATARACT EXTRACTION W/PHACO: SHX586

## 2018-05-10 SURGERY — PHACOEMULSIFICATION, CATARACT, WITH IOL INSERTION
Anesthesia: Monitor Anesthesia Care | Site: Eye | Laterality: Right

## 2018-05-10 MED ORDER — SODIUM HYALURONATE 10 MG/ML IO SOLN
INTRAOCULAR | Status: DC | PRN
  Administered 2018-05-10: 0.55 mL via INTRAOCULAR

## 2018-05-10 MED ORDER — MIDAZOLAM HCL 2 MG/2ML IJ SOLN
INTRAMUSCULAR | Status: DC | PRN
  Administered 2018-05-10 (×2): 1 mg via INTRAVENOUS

## 2018-05-10 MED ORDER — ONDANSETRON HCL 4 MG/2ML IJ SOLN
INTRAMUSCULAR | Status: DC | PRN
  Administered 2018-05-10: 4 mg via INTRAVENOUS

## 2018-05-10 MED ORDER — ACETAMINOPHEN 160 MG/5ML PO SOLN: 325.0000 mg | Freq: Once | ORAL | Status: DC

## 2018-05-10 MED ORDER — LIDOCAINE HCL (PF) 2 % IJ SOLN
INTRAOCULAR | Status: DC | PRN
  Administered 2018-05-10: 1 mL via INTRAOCULAR

## 2018-05-10 MED ORDER — FENTANYL CITRATE (PF) 100 MCG/2ML IJ SOLN
INTRAMUSCULAR | Status: DC | PRN
  Administered 2018-05-10 (×2): 50 ug via INTRAVENOUS

## 2018-05-10 MED ORDER — ARMC OPHTHALMIC DILATING DROPS
1.0000 "application " | OPHTHALMIC | Status: DC | PRN
  Administered 2018-05-10 (×3): 1 via OPHTHALMIC

## 2018-05-10 MED ORDER — ACETAMINOPHEN 325 MG PO TABS: 325.0000 mg | ORAL_TABLET | Freq: Once | ORAL | Status: DC

## 2018-05-10 MED ORDER — SODIUM HYALURONATE 23 MG/ML IO SOLN
INTRAOCULAR | Status: DC | PRN
  Administered 2018-05-10: 0.6 mL via INTRAOCULAR

## 2018-05-10 MED ORDER — MOXIFLOXACIN HCL 0.5 % OP SOLN
OPHTHALMIC | Status: DC | PRN
  Administered 2018-05-10: 0.2 mL via OPHTHALMIC

## 2018-05-10 MED ORDER — TETRACAINE HCL 0.5 % OP SOLN
1.0000 [drp] | OPHTHALMIC | Status: DC | PRN
  Administered 2018-05-10 (×2): 1 [drp] via OPHTHALMIC

## 2018-05-10 MED ORDER — EPINEPHRINE PF 1 MG/ML IJ SOLN
INTRAOCULAR | Status: DC | PRN
  Administered 2018-05-10: 85 mL via OPHTHALMIC

## 2018-05-10 SURGICAL SUPPLY — 19 items
CANNULA ANT/CHMB 27G (MISCELLANEOUS) ×1 IMPLANT
CANNULA ANT/CHMB 27GA (MISCELLANEOUS) ×3 IMPLANT
DISSECTOR HYDRO NUCLEUS 50X22 (MISCELLANEOUS) ×3 IMPLANT
GLOVE SURG LX 7.5 STRW (GLOVE) ×2
GLOVE SURG LX STRL 7.5 STRW (GLOVE) ×1 IMPLANT
GLOVE SURG SYN 8.5  E (GLOVE) ×2
GLOVE SURG SYN 8.5 E (GLOVE) ×1 IMPLANT
GLOVE SURG SYN 8.5 PF PI (GLOVE) ×1 IMPLANT
GOWN STRL REUS W/ TWL LRG LVL3 (GOWN DISPOSABLE) ×2 IMPLANT
GOWN STRL REUS W/TWL LRG LVL3 (GOWN DISPOSABLE) ×4
LENS IOL TECNIS ITEC 20.5 (Intraocular Lens) ×2 IMPLANT
MARKER SKIN DUAL TIP RULER LAB (MISCELLANEOUS) ×3 IMPLANT
PACK DR. KING ARMS (PACKS) ×3 IMPLANT
PACK EYE AFTER SURG (MISCELLANEOUS) ×3 IMPLANT
PACK OPTHALMIC (MISCELLANEOUS) ×3 IMPLANT
SYR 3ML LL SCALE MARK (SYRINGE) ×3 IMPLANT
SYR TB 1ML LUER SLIP (SYRINGE) ×3 IMPLANT
WATER STERILE IRR 500ML POUR (IV SOLUTION) ×3 IMPLANT
WIPE NON LINTING 3.25X3.25 (MISCELLANEOUS) ×3 IMPLANT

## 2018-05-10 NOTE — Anesthesia Preprocedure Evaluation (Signed)
Anesthesia Evaluation  Patient identified by MRN, date of birth, ID band Patient awake    Reviewed: Allergy & Precautions, H&P , NPO status , Patient's Chart, lab work & pertinent test results  Airway Mallampati: II  TM Distance: >3 FB Neck ROM: full    Dental no notable dental hx.    Pulmonary    Pulmonary exam normal breath sounds clear to auscultation       Cardiovascular hypertension, Normal cardiovascular exam Rhythm:regular Rate:Normal     Neuro/Psych    GI/Hepatic GERD  ,  Endo/Other  diabetes  Renal/GU      Musculoskeletal   Abdominal   Peds  Hematology   Anesthesia Other Findings   Reproductive/Obstetrics                             Anesthesia Physical  Anesthesia Plan  ASA: II  Anesthesia Plan: MAC   Post-op Pain Management:    Induction:   PONV Risk Score and Plan: 2 and Midazolam and Treatment may vary due to age or medical condition  Airway Management Planned:   Additional Equipment:   Intra-op Plan:   Post-operative Plan:   Informed Consent: I have reviewed the patients History and Physical, chart, labs and discussed the procedure including the risks, benefits and alternatives for the proposed anesthesia with the patient or authorized representative who has indicated his/her understanding and acceptance.     Plan Discussed with: CRNA  Anesthesia Plan Comments:         Anesthesia Quick Evaluation

## 2018-05-10 NOTE — H&P (Signed)
The History and Physical notes are on paper, have been signed, and are to be scanned.   I have examined the patient and there are no changes to the H&P.   Benay Pillow 05/10/2018 7:59 AM

## 2018-05-10 NOTE — Op Note (Signed)
OPERATIVE NOTE  Kimberly Bright [id number] 05/10/2018   PREOPERATIVE DIAGNOSIS:  Nuclear sclerotic cataract right eye.  H25.11   POSTOPERATIVE DIAGNOSIS:    Nuclear sclerotic cataract right eye.     PROCEDURE:  Phacoemusification with posterior chamber intraocular lens placement of the right eye   LENS:   Implant Name Type Inv. Item Serial No. Manufacturer Lot No. LRB No. Used  TECNIS 1 ASPHERIC IOL Intraocular Lens  [id number] [employer]  Right 1       PCB00 +20.5   ULTRASOUND TIME: 0 minutes 50 seconds.  CDE 5.83   SURGEON:  Benay Pillow, MD, MPH  ANESTHESIOLOGIST: Anesthesiologist: Veda Canning, MD CRNA: Izetta Dakin, CRNA   ANESTHESIA:  Topical with tetracaine drops augmented with 1% preservative-free intracameral lidocaine.  ESTIMATED BLOOD LOSS: less than 1 mL.   COMPLICATIONS:  None.   DESCRIPTION OF PROCEDURE:  The patient was identified in the holding room and transported to the operating room and placed in the supine position under the operating microscope.  The right eye was identified as the operative eye and it was prepped and draped in the usual sterile ophthalmic fashion.   A 1.0 millimeter clear-corneal paracentesis was made at the 10:30 position. 0.5 ml of preservative-free 1% lidocaine with epinephrine was injected into the anterior chamber.  The anterior chamber was filled with Healon 5 viscoelastic.  A 2.4 millimeter keratome was used to make a near-clear corneal incision at the 8:00 position.  A curvilinear capsulorrhexis was made with a cystotome and capsulorrhexis forceps.  Balanced salt solution was used to hydrodissect and hydrodelineate the nucleus.   Phacoemulsification was then used in stop and chop fashion to remove the lens nucleus and epinucleus.  The remaining cortex was then removed using the irrigation and aspiration handpiece. Healon was then placed into the capsular bag to distend it for lens placement.  A lens was then injected  into the capsular bag.  The remaining viscoelastic was aspirated.   Wounds were hydrated with balanced salt solution.  The anterior chamber was inflated to a physiologic pressure with balanced salt solution.   Intracameral vigamox 0.1 mL undiluted was injected into the eye and a drop placed onto the ocular surface.  No wound leaks were noted.  The patient was taken to the recovery room in stable condition without complications of anesthesia or surgery  Benay Pillow 05/10/2018, 8:29 AM

## 2018-05-10 NOTE — Transfer of Care (Signed)
Immediate Anesthesia Transfer of Care Note  Patient: Kimberly Bright  Procedure(s) Performed: CATARACT EXTRACTION PHACO AND INTRAOCULAR LENS PLACEMENT ([facility])  RIGHT (Right Eye)  Patient Location: PACU  Anesthesia Type: MAC  Level of Consciousness: awake, alert  and patient cooperative  Airway and Oxygen Therapy: Patient Spontanous Breathing and Patient connected to supplemental oxygen  Post-op Assessment: Post-op Vital signs reviewed, Patient's Cardiovascular Status Stable, Respiratory Function Stable, Patent Airway and No signs of Nausea or vomiting  Post-op Vital Signs: Reviewed and stable  Complications: No apparent anesthesia complications

## 2018-05-10 NOTE — Anesthesia Postprocedure Evaluation (Signed)
Anesthesia Post Note  Patient: Kimberly Bright  Procedure(s) Performed: CATARACT EXTRACTION PHACO AND INTRAOCULAR LENS PLACEMENT ([place])  RIGHT (Right Eye)  Patient location during evaluation: PACU Anesthesia Type: MAC Level of consciousness: awake and alert Pain management: pain level controlled Vital Signs Assessment: post-procedure vital signs reviewed and stable Respiratory status: spontaneous breathing, nonlabored ventilation, respiratory function stable and patient connected to nasal cannula oxygen Cardiovascular status: stable and blood pressure returned to baseline Postop Assessment: no apparent nausea or vomiting Anesthetic complications: no    Veda Canning

## 2018-05-10 NOTE — Anesthesia Procedure Notes (Signed)
Procedure Name: MAC Performed by: Izetta Dakin, CRNA Pre-anesthesia Checklist: Patient identified, Emergency Drugs available, Suction available, Patient being monitored and Timeout performed Patient Re-evaluated:Patient Re-evaluated prior to induction Oxygen Delivery Method: Nasal cannula

## 2018-09-01 ENCOUNTER — Other Ambulatory Visit: Payer: Self-pay | Admitting: Gastroenterology

## 2018-09-01 DIAGNOSIS — K7581 Nonalcoholic steatohepatitis (NASH): Secondary | ICD-10-CM

## 2018-09-13 ENCOUNTER — Ambulatory Visit: Payer: Medicare Other

## 2018-10-18 ENCOUNTER — Ambulatory Visit: Payer: Medicare Other

## 2018-11-11 ENCOUNTER — Ambulatory Visit
Admission: RE | Admit: 2018-11-11 | Discharge: 2018-11-11 | Disposition: A | Discharge status: Home / Self Care | Payer: Medicare Other | Source: Ambulatory Visit | Attending: Gastroenterology | Admitting: Gastroenterology

## 2018-11-11 ENCOUNTER — Other Ambulatory Visit: Payer: Self-pay

## 2018-11-11 DIAGNOSIS — K7581 Nonalcoholic steatohepatitis (NASH): Secondary | ICD-10-CM | POA: Diagnosis present

## 2019-11-23 ENCOUNTER — Other Ambulatory Visit: Payer: Self-pay | Admitting: Gastroenterology

## 2019-11-23 DIAGNOSIS — K760 Fatty (change of) liver, not elsewhere classified: Secondary | ICD-10-CM

## 2019-11-30 ENCOUNTER — Ambulatory Visit
Admission: RE | Admit: 2019-11-30 | Discharge: 2019-11-30 | Disposition: A | Discharge status: Home / Self Care | Payer: Medicare Other | Source: Ambulatory Visit | Attending: Gastroenterology | Admitting: Gastroenterology

## 2019-11-30 ENCOUNTER — Other Ambulatory Visit: Payer: Self-pay

## 2019-11-30 DIAGNOSIS — K760 Fatty (change of) liver, not elsewhere classified: Secondary | ICD-10-CM | POA: Insufficient documentation

## 2019-12-07 ENCOUNTER — Other Ambulatory Visit: Payer: Self-pay | Admitting: Family Medicine

## 2019-12-07 DIAGNOSIS — Z1231 Encounter for screening mammogram for malignant neoplasm of breast: Secondary | ICD-10-CM

## 2019-12-08 ENCOUNTER — Other Ambulatory Visit: Payer: Self-pay

## 2019-12-08 ENCOUNTER — Ambulatory Visit
Admission: RE | Admit: 2019-12-08 | Discharge: 2019-12-08 | Disposition: A | Discharge status: Home / Self Care | Payer: Medicare Other | Source: Ambulatory Visit | Attending: Family Medicine | Admitting: Family Medicine

## 2019-12-08 DIAGNOSIS — Z1231 Encounter for screening mammogram for malignant neoplasm of breast: Secondary | ICD-10-CM | POA: Insufficient documentation

## 2020-04-22 ENCOUNTER — Other Ambulatory Visit: Payer: Self-pay

## 2020-04-22 ENCOUNTER — Encounter: Payer: Self-pay | Admitting: Emergency Medicine

## 2020-04-22 ENCOUNTER — Ambulatory Visit
Admission: EM | Admit: 2020-04-22 | Discharge: 2020-04-22 | Disposition: A | Discharge status: Home / Self Care | Payer: Medicare Other | Attending: Physician Assistant | Admitting: Physician Assistant

## 2020-04-22 DIAGNOSIS — M533 Sacrococcygeal disorders, not elsewhere classified: Secondary | ICD-10-CM | POA: Diagnosis not present

## 2020-04-22 LAB — URINALYSIS, COMPLETE (UACMP) WITH MICROSCOPIC
Bacteria, UA: NONE SEEN
Bilirubin Urine: NEGATIVE
Glucose, UA: NEGATIVE mg/dL
Hgb urine dipstick: NEGATIVE
Ketones, ur: 15 mg/dL — AB
Leukocytes,Ua: NEGATIVE
Nitrite: NEGATIVE
Specific Gravity, Urine: 1.02 (ref 1.005–1.030)
pH: 6.5 (ref 5.0–8.0)

## 2020-04-22 MED ORDER — METHYLPREDNISOLONE 4 MG PO TBPK: ORAL_TABLET | ORAL | 0 refills | Status: AC

## 2020-04-22 NOTE — Discharge Instructions (Addendum)
Take medications as directed. Work on gentle exercises for your back.

## 2020-04-22 NOTE — ED Triage Notes (Signed)
Patient c/o left sided lower back pain that started this morning.  Patient denies V/D.  Patient denies fevers.  Patient denies any urinary symptoms.

## 2020-04-22 NOTE — ED Provider Notes (Signed)
[facility]    CSN: [id number] Arrival date & time: 04/22/20  1424      History   Chief Complaint Chief Complaint  Patient presents with  . Back Pain    HPI Kimberly Bright is a 73 y.o. female who presents today for evaluation of left-sided low back pain ongoing since this morning.  The patient denies any falls or trauma affecting the lower back.  She denies any urinary symptoms, no increased frequency or painful urination.  She does have a history of IBS and does have chronic diarrhea.  The patient denies any radiation of the pain into the anterior groin or down the left leg.  Pain is an aching discomfort located along the posterior lateral aspect of the lumbar spine.  No surgical history to the lumbar spine or left hip.  She does have a history of a coccyx fracture treated nonsurgically in the last several months.  She is not taking anything for discomfort at this time.  She does have history of fatty liver and is unable to take Tylenol.  HPI  Past Medical History:  Diagnosis Date  . Cancer ([place])    skin ca  . Cirrhosis ([place])   . Diabetes mellitus without complication ([place])   . Fatty liver   . GERD (gastroesophageal reflux disease)   . Hyperlipidemia   . Hypertension   . Hypokalemia     Patient Active Problem List   Diagnosis Date Noted  . Medication side effect 12/05/2014  . Contact dermatitis 12/05/2014  . 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 Decreased potassium in the blood 11/03/2014  . Routine general medical examination at a health care facility 11/03/2014  . Abnormal finding on liver function 11/03/2014  . Essential (primary) hypertension 11/03/2014  . Screening for depression 11/03/2014  . Illness 11/03/2014    Past Surgical History:  Procedure Laterality Date  . CATARACT EXTRACTION W/PHACO Left 04/13/2018   Procedure: CATARACT EXTRACTION PHACO AND INTRAOCULAR LENS PLACEMENT ([place]) LEFT;  Surgeon: Eulogio Bear,  MD;  Location: [place];  Service: Ophthalmology;  Laterality: Left;  . CATARACT EXTRACTION W/PHACO Right 05/10/2018   Procedure: CATARACT EXTRACTION PHACO AND INTRAOCULAR LENS PLACEMENT ([place])  RIGHT;  Surgeon: Eulogio Bear, MD;  Location: [place];  Service: Ophthalmology;  Laterality: Right;  . COLONOSCOPY N/A 05/18/2017   Procedure: COLONOSCOPY;  Surgeon: Lollie Sails, MD;  Location: [facility] ENDOSCOPY;  Service: Endoscopy;  Laterality: N/A;  . ESOPHAGOGASTRODUODENOSCOPY (EGD) WITH PROPOFOL N/A 10/08/2015   Procedure: ESOPHAGOGASTRODUODENOSCOPY (EGD) WITH PROPOFOL;  Surgeon: Lollie Sails, MD;  Location: [facility] ENDOSCOPY;  Service: Endoscopy;  Laterality: N/A;  . KNEE SURGERY     meniscal tear repair    OB History   No obstetric history on file.      Home Medications    Prior to Admission medications   Medication Sig Start Date End Date Taking? Authorizing Provider  amLODipine (NORVASC) 10 MG tablet TAKE (1) TABLET BY MOUTH EVERY DAY 05/07/15  Yes Juline Patch, MD  hydrochlorothiazide (HYDRODIURIL) 25 MG tablet Take 1 tablet (25 mg total) by mouth daily. 12/05/14  Yes Juline Patch, MD  metoprolol succinate (TOPROL-XL) 100 MG 24 hr tablet Take 1 tablet (100 mg total) by mouth daily. 12/05/14  Yes Juline Patch, MD  MULTIPLE VITAMINS-MINERALS PO Take 1 tablet by mouth daily.   Yes [provider]  omeprazole (PRILOSEC) 20 MG capsule Take 20 mg by mouth daily.   Yes  [provider]  potassium chloride SA (K-DUR,KLOR-CON) 20 MEQ tablet Take 1 tablet by mouth daily. 10/23/14  Yes [provider]  rosuvastatin (CRESTOR) 5 MG tablet Take 5 mg by mouth 3 (three) times a week.   Yes [provider]  valsartan (DIOVAN) 80 MG tablet Take 80 mg by mouth daily. 03/14/20  Yes [provider]  vitamin E 400 UNIT capsule Take 1 capsule by mouth daily.   Yes [provider]  CHOLINE BITARTRATE Take 5 tablets by  mouth daily.    [provider]  methylPREDNISolone (MEDROL DOSEPAK) 4 MG TBPK tablet Take per package instructions 04/22/20   Lattie Corns, PA-C  Milk Thistle 250 MG CAPS Take 1 capsule by mouth daily.    [provider]  UNABLE TO FIND 2 (two) times daily. Livatone supplement    [provider]    Family History Family History  Problem Relation Age of Onset  . Cancer Mother   . Breast cancer Sister 78       pat half sister    Social History Social History   Tobacco Use  . Smoking status: Never Smoker  . Smokeless tobacco: Never Used  Vaping Use  . Vaping Use: Never used  Substance Use Topics  . Alcohol use: No    Alcohol/week: 0.0 standard drinks  . Drug use: No     Allergies   Ace inhibitors and Lipitor [atorvastatin]   Review of Systems Review of Systems  Constitutional: Negative.   HENT: Negative.   Eyes: Negative.   Respiratory: Negative.   Gastrointestinal: Negative.   Endocrine: Negative.   Genitourinary: Negative.   Musculoskeletal: Positive for back pain.  Skin: Negative.   Allergic/Immunologic: Negative.   Neurological: Negative.   Hematological: Negative.   Psychiatric/Behavioral: Negative.    Physical Exam Triage Vital Signs ED Triage Vitals  Enc Vitals Group     BP 04/22/20 1454 (!) 157/71     Pulse Rate 04/22/20 1454 78     Resp 04/22/20 1454 14     Temp 04/22/20 1454 98.4 F (36.9 C)     Temp Source 04/22/20 1454 Oral     SpO2 04/22/20 1454 (!) 86 %     Weight 04/22/20 1450 164 lb (74.4 kg)     Height 04/22/20 1450 5' 7"  (1.702 m)     Head Circumference --      Peak Flow --      Pain Score 04/22/20 1450 8     Pain Loc --      Pain Edu? --      Excl. in [place]? --    No data found.  Updated Vital Signs BP (!) 157/71 (BP Location: Left Arm)   Pulse 78   Temp 98.4 F (36.9 C) (Oral)   Resp 14   Ht 5' 7"  (1.702 m)   Wt 164 lb (74.4 kg)   SpO2 (!) 86%   BMI 25.69 kg/m   Visual Acuity Right Eye  Distance:   Left Eye Distance:   Bilateral Distance:    Right Eye Near:   Left Eye Near:    Bilateral Near:     Physical Exam Lumbar Spine: Examination of the lumbar spine reveals no bony abnormality, no edema, and no ecchymosis.  There is no step off.  The patient does have limited range of motion with both lumbar spine flexion and extension however she does not have significant pain.  Limited left and right bend with rotation.  The patient has a negative axial load test, and a negative rotational Waddell test.  The patient is non tender along the spinous process.  The patient is non tender along the paravertebral muscles, with no muscle spasms.  The patient is non tender along the iliac crest.  The patient is non tender in the sciatic notch.  The patient does have tenderness with palpation to the left SI joint.  There is no Coccyx joint tenderness.    Bilateral Lower Extremities: Examination of the lower extremities reveals no bony abnormality, no edema, and no ecchymosis.  The patient has full active and passive range of motion of the hips, knees, and ankles.  There is no discomfort with range of motion exercises.  The patient is non tender along the greater trochanter region.  The patient has a negative Bevelyn Buckles' test bilaterally.  There is normal skin warmth.  There is normal capillary refill bilaterally.    Neurologic: The patient has a negative straight leg raise.  The patient has normal muscle strength testing for the quadriceps, calves, ankle dorsiflexion, ankle plantarflexion, and extensor hallicus longus.  The patient has sensation that is intact to light touch.  The deep tendon reflexes are nor  UC Treatments / Results  Labs (all labs ordered are listed, but only abnormal results are displayed) Labs Reviewed  URINALYSIS, COMPLETE (UACMP) WITH MICROSCOPIC - Abnormal; Notable for the following components:      Result Value   Ketones, ur 15 (*)    Protein, ur TRACE (*)    All other  components within normal limits    EKG   Radiology No results found.  Procedures Procedures (including critical care time)  Medications Ordered in UC Medications - No data to display  Initial Impression / Assessment and Plan / UC Course  I have reviewed the triage vital signs and the nursing notes.  Pertinent labs & imaging results that were available during my care of the patient were reviewed by me and considered in my medical decision making (see chart for details).     1.  Treatment options were discussed today with the patient. 2.  Urinalysis does not demonstrate evidence of a urinary tract infection, she is not complaining of any symptoms at today's appointment. 3.  I believe the patient is having discomfort related to underlying sacroiliitis at this time. 4.  The patient was instructed to look up gentle exercises for the sacroiliac joint.  She was also given a Medrol Dosepak, she is unable to take Tylenol due to history of fatty liver, I do not feel comfortable prescribing anti-inflammatories due to history of chronic diarrhea and IBS. 5.  She will follow-up with her PCP if symptoms continue. Final Clinical Impressions(s) / UC Diagnoses   Final diagnoses:  Sacroiliac pain     Discharge Instructions     Take medications as directed. Work on gentle exercises for your back.   ED Prescriptions    Medication Sig Dispense Auth. Provider   methylPREDNISolone (MEDROL DOSEPAK) 4 MG TBPK tablet Take per package instructions 21 tablet Lattie Corns, PA-C     PDMP not reviewed this encounter.   Lattie Corns, PA-C 04/22/20 1555

## 2021-01-09 ENCOUNTER — Other Ambulatory Visit: Payer: Self-pay | Admitting: Gastroenterology

## 2021-01-09 DIAGNOSIS — K760 Fatty (change of) liver, not elsewhere classified: Secondary | ICD-10-CM

## 2021-01-15 ENCOUNTER — Ambulatory Visit
Admission: RE | Admit: 2021-01-15 | Discharge: 2021-01-15 | Disposition: A | Discharge status: Home / Self Care | Payer: Medicare Other | Source: Ambulatory Visit | Attending: Gastroenterology | Admitting: Gastroenterology

## 2021-01-15 ENCOUNTER — Other Ambulatory Visit: Payer: Self-pay

## 2021-01-15 DIAGNOSIS — K760 Fatty (change of) liver, not elsewhere classified: Secondary | ICD-10-CM | POA: Insufficient documentation

## 2021-02-18 ENCOUNTER — Other Ambulatory Visit: Payer: Self-pay | Admitting: Family Medicine

## 2021-02-18 DIAGNOSIS — Z1231 Encounter for screening mammogram for malignant neoplasm of breast: Secondary | ICD-10-CM

## 2021-02-19 ENCOUNTER — Ambulatory Visit
Admission: RE | Admit: 2021-02-19 | Discharge: 2021-02-19 | Disposition: A | Discharge status: Home / Self Care | Payer: Medicare Other | Source: Ambulatory Visit | Attending: Family Medicine | Admitting: Family Medicine

## 2021-02-19 ENCOUNTER — Other Ambulatory Visit: Payer: Self-pay

## 2021-02-19 DIAGNOSIS — Z1231 Encounter for screening mammogram for malignant neoplasm of breast: Secondary | ICD-10-CM | POA: Diagnosis present

## 2022-01-13 ENCOUNTER — Other Ambulatory Visit: Payer: Self-pay | Admitting: Gastroenterology

## 2022-01-13 DIAGNOSIS — K760 Fatty (change of) liver, not elsewhere classified: Secondary | ICD-10-CM

## 2022-01-13 DIAGNOSIS — R748 Abnormal levels of other serum enzymes: Secondary | ICD-10-CM

## 2022-01-14 ENCOUNTER — Other Ambulatory Visit: Payer: Self-pay | Admitting: Gastroenterology

## 2022-01-14 DIAGNOSIS — R748 Abnormal levels of other serum enzymes: Secondary | ICD-10-CM

## 2022-01-14 DIAGNOSIS — K760 Fatty (change of) liver, not elsewhere classified: Secondary | ICD-10-CM

## 2022-01-17 ENCOUNTER — Ambulatory Visit
Admission: RE | Admit: 2022-01-17 | Discharge: 2022-01-17 | Disposition: A | Discharge status: Home / Self Care | Payer: Medicare Other | Source: Ambulatory Visit | Attending: Gastroenterology | Admitting: Gastroenterology

## 2022-01-17 DIAGNOSIS — K760 Fatty (change of) liver, not elsewhere classified: Secondary | ICD-10-CM

## 2022-01-17 DIAGNOSIS — R748 Abnormal levels of other serum enzymes: Secondary | ICD-10-CM

## 2022-03-31 ENCOUNTER — Other Ambulatory Visit: Payer: Self-pay | Admitting: Family Medicine

## 2022-03-31 DIAGNOSIS — Z1231 Encounter for screening mammogram for malignant neoplasm of breast: Secondary | ICD-10-CM

## 2022-04-16 ENCOUNTER — Ambulatory Visit
Admission: RE | Admit: 2022-04-16 | Discharge: 2022-04-16 | Disposition: A | Discharge status: Home / Self Care | Payer: Medicare Other | Source: Ambulatory Visit | Attending: Family Medicine | Admitting: Family Medicine

## 2022-04-16 DIAGNOSIS — Z1231 Encounter for screening mammogram for malignant neoplasm of breast: Secondary | ICD-10-CM | POA: Diagnosis present

## 2022-08-21 ENCOUNTER — Other Ambulatory Visit: Payer: Self-pay | Admitting: Gastroenterology

## 2022-08-21 DIAGNOSIS — K760 Fatty (change of) liver, not elsewhere classified: Secondary | ICD-10-CM

## 2022-08-25 ENCOUNTER — Ambulatory Visit
Admission: RE | Admit: 2022-08-25 | Discharge: 2022-08-25 | Disposition: A | Discharge status: Home / Self Care | Payer: Medicare Other | Source: Ambulatory Visit | Attending: Gastroenterology | Admitting: Gastroenterology

## 2022-08-25 DIAGNOSIS — K760 Fatty (change of) liver, not elsewhere classified: Secondary | ICD-10-CM | POA: Diagnosis present

## 2022-08-29 ENCOUNTER — Other Ambulatory Visit: Payer: Self-pay | Admitting: Gastroenterology

## 2022-08-29 DIAGNOSIS — K824 Cholesterolosis of gallbladder: Secondary | ICD-10-CM

## 2022-08-29 DIAGNOSIS — K7460 Unspecified cirrhosis of liver: Secondary | ICD-10-CM

## 2022-09-10 ENCOUNTER — Ambulatory Visit
Admission: RE | Admit: 2022-09-10 | Discharge: 2022-09-10 | Disposition: A | Discharge status: Home / Self Care | Payer: Medicare Other | Source: Ambulatory Visit | Attending: Gastroenterology | Admitting: Gastroenterology

## 2022-09-10 ENCOUNTER — Other Ambulatory Visit: Payer: Self-pay | Admitting: Gastroenterology

## 2022-09-10 DIAGNOSIS — K7460 Unspecified cirrhosis of liver: Secondary | ICD-10-CM | POA: Insufficient documentation

## 2022-09-10 DIAGNOSIS — K824 Cholesterolosis of gallbladder: Secondary | ICD-10-CM

## 2022-09-10 MED ORDER — GADOBUTROL 1 MMOL/ML IV SOLN
6.0000 mL | Freq: Once | INTRAVENOUS | Status: AC | PRN
  Administered 2022-09-10: 6 mL via INTRAVENOUS

## 2023-04-01 ENCOUNTER — Other Ambulatory Visit: Payer: Self-pay | Admitting: Gastroenterology

## 2023-04-01 DIAGNOSIS — K760 Fatty (change of) liver, not elsewhere classified: Secondary | ICD-10-CM

## 2023-04-06 ENCOUNTER — Ambulatory Visit
Admission: RE | Admit: 2023-04-06 | Discharge: 2023-04-06 | Disposition: A | Discharge status: Home / Self Care | Payer: Medicare Other | Source: Ambulatory Visit | Attending: Gastroenterology | Admitting: Gastroenterology

## 2023-04-06 DIAGNOSIS — K760 Fatty (change of) liver, not elsewhere classified: Secondary | ICD-10-CM | POA: Diagnosis present

## 2023-08-26 ENCOUNTER — Other Ambulatory Visit: Payer: Self-pay | Admitting: General Surgery

## 2023-08-26 DIAGNOSIS — K824 Cholesterolosis of gallbladder: Secondary | ICD-10-CM

## 2023-09-01 ENCOUNTER — Ambulatory Visit
Admission: RE | Admit: 2023-09-01 | Discharge: 2023-09-01 | Disposition: A | Discharge status: Home / Self Care | Source: Ambulatory Visit | Attending: General Surgery | Admitting: General Surgery

## 2023-09-01 DIAGNOSIS — K824 Cholesterolosis of gallbladder: Secondary | ICD-10-CM | POA: Diagnosis present

## 2024-03-15 DIAGNOSIS — E1122 Type 2 diabetes mellitus with diabetic chronic kidney disease: Secondary | ICD-10-CM | POA: Diagnosis not present

## 2024-03-15 DIAGNOSIS — N1831 Chronic kidney disease, stage 3a: Secondary | ICD-10-CM | POA: Diagnosis not present

## 2024-03-15 DIAGNOSIS — I10 Essential (primary) hypertension: Secondary | ICD-10-CM | POA: Diagnosis not present

## 2024-03-15 DIAGNOSIS — E782 Mixed hyperlipidemia: Secondary | ICD-10-CM | POA: Diagnosis not present

## 2024-03-17 DIAGNOSIS — Z23 Encounter for immunization: Secondary | ICD-10-CM | POA: Diagnosis not present

## 2024-04-20 DIAGNOSIS — K760 Fatty (change of) liver, not elsewhere classified: Secondary | ICD-10-CM | POA: Diagnosis not present

## 2024-04-20 DIAGNOSIS — K529 Noninfective gastroenteritis and colitis, unspecified: Secondary | ICD-10-CM | POA: Diagnosis not present

## 2024-06-09 ENCOUNTER — Ambulatory Visit: Admission: RE | Admit: 2024-06-09 | Source: Ambulatory Visit

## 2024-06-09 ENCOUNTER — Other Ambulatory Visit: Payer: Self-pay | Admitting: Physician Assistant

## 2024-06-09 DIAGNOSIS — S42291A Other displaced fracture of upper end of right humerus, initial encounter for closed fracture: Secondary | ICD-10-CM
